# Patient Record
Sex: Male | Born: 1975 | Hispanic: Yes | Marital: Single | Smoking: Never smoker
Health system: Southern US, Community
[De-identification: ages and names within clinical notes are randomized; demographics above are authoritative.]

## PROBLEM LIST (undated history)

## (undated) DIAGNOSIS — M109 Gout, unspecified: Secondary | ICD-10-CM

## (undated) DIAGNOSIS — H269 Unspecified cataract: Secondary | ICD-10-CM

## (undated) HISTORY — PX: CATARACT EXTRACTION: SUR2

---

## 2011-08-15 ENCOUNTER — Emergency Department (HOSPITAL_COMMUNITY)
Admission: EM | Admit: 2011-08-15 | Discharge: 2011-08-16 | Disposition: A | Discharge status: Home / Self Care | Payer: Self-pay | Attending: Emergency Medicine | Admitting: Emergency Medicine

## 2011-08-15 DIAGNOSIS — R229 Localized swelling, mass and lump, unspecified: Secondary | ICD-10-CM | POA: Insufficient documentation

## 2011-08-17 ENCOUNTER — Inpatient Hospital Stay (INDEPENDENT_AMBULATORY_CARE_PROVIDER_SITE_OTHER)
Admission: RE | Admit: 2011-08-17 | Discharge: 2011-08-17 | Disposition: A | Discharge status: Home / Self Care | Payer: Self-pay | Source: Ambulatory Visit | Attending: Family Medicine | Admitting: Family Medicine

## 2011-08-17 DIAGNOSIS — I889 Nonspecific lymphadenitis, unspecified: Secondary | ICD-10-CM

## 2011-08-17 DIAGNOSIS — IMO0002 Reserved for concepts with insufficient information to code with codable children: Secondary | ICD-10-CM

## 2011-08-20 LAB — CULTURE, ROUTINE-ABSCESS

## 2013-03-30 ENCOUNTER — Encounter (HOSPITAL_COMMUNITY): Payer: Self-pay | Admitting: *Deleted

## 2013-03-30 ENCOUNTER — Emergency Department (HOSPITAL_COMMUNITY): Payer: BC Managed Care – PPO

## 2013-03-30 DIAGNOSIS — Y9389 Activity, other specified: Secondary | ICD-10-CM | POA: Insufficient documentation

## 2013-03-30 DIAGNOSIS — Z8669 Personal history of other diseases of the nervous system and sense organs: Secondary | ICD-10-CM | POA: Insufficient documentation

## 2013-03-30 DIAGNOSIS — S93409A Sprain of unspecified ligament of unspecified ankle, initial encounter: Secondary | ICD-10-CM | POA: Insufficient documentation

## 2013-03-30 DIAGNOSIS — X58XXXA Exposure to other specified factors, initial encounter: Secondary | ICD-10-CM | POA: Insufficient documentation

## 2013-03-30 DIAGNOSIS — Y9366 Activity, soccer: Secondary | ICD-10-CM | POA: Insufficient documentation

## 2013-03-30 DIAGNOSIS — Y9239 Other specified sports and athletic area as the place of occurrence of the external cause: Secondary | ICD-10-CM | POA: Insufficient documentation

## 2013-03-30 DIAGNOSIS — Y92838 Other recreation area as the place of occurrence of the external cause: Secondary | ICD-10-CM | POA: Insufficient documentation

## 2013-03-30 NOTE — ED Notes (Signed)
Pt c/o left ankle pain and swelling x 4 days also has "knot" on left knee.  Denies any known injury but does play soccer and states should have injured it during soccer.

## 2013-03-31 ENCOUNTER — Emergency Department (HOSPITAL_COMMUNITY)
Admission: EM | Admit: 2013-03-31 | Discharge: 2013-03-31 | Disposition: A | Discharge status: Home / Self Care | Payer: BC Managed Care – PPO | Attending: Emergency Medicine | Admitting: Emergency Medicine

## 2013-03-31 DIAGNOSIS — S93402A Sprain of unspecified ligament of left ankle, initial encounter: Secondary | ICD-10-CM

## 2013-03-31 HISTORY — DX: Unspecified cataract: H26.9

## 2013-03-31 MED ORDER — IBUPROFEN 800 MG PO TABS: 800.0000 mg | ORAL_TABLET | Freq: Three times a day (TID) | ORAL | Status: DC

## 2013-03-31 NOTE — ED Notes (Signed)
No answer in waiting room 

## 2013-03-31 NOTE — ED Provider Notes (Signed)
Medical screening examination/treatment/procedure(s) were performed by non-physician practitioner and as supervising physician I was immediately available for consultation/collaboration.  Sion Thane M Everson Mott, MD 03/31/13 0751 

## 2013-03-31 NOTE — ED Provider Notes (Signed)
History     CSN: [id number]  Arrival date & time 03/30/13  2246   None     Chief Complaint  Patient presents with  . Ankle Pain    (Consider location/radiation/quality/duration/timing/severity/associated sxs/prior treatment) HPI History provided by pt.   Pt has had pain and edema of L ankle for the past several days.  Denies trauma but plays soccer on a regular basis.  Aggravated by bearing weight.  Has not taken anything for pain.  No associated fever, skin changes or paresthesias.   Past Medical History  Diagnosis Date  . Cataract     Past Surgical History  Procedure Laterality Date  . Cataract extraction      History reviewed. No pertinent family history.  History  Substance Use Topics  . Smoking status: Never Smoker   . Smokeless tobacco: Not on file  . Alcohol Use: Yes      Review of Systems  All other systems reviewed and are negative.    Allergies  Vicodin  Home Medications   Current Outpatient Rx  Name  Route  Sig  Dispense  Refill  . ibuprofen (ADVIL,MOTRIN) 800 MG tablet   Oral   Take 1 tablet (800 mg total) by mouth 3 (three) times daily.   12 tablet   0     BP 155/88  Pulse 81  Temp(Src) 98.4 F (36.9 C)  Resp 16  SpO2 98%  Physical Exam  Nursing note and vitals reviewed. Constitutional: He is oriented to person, place, and time. He appears well-developed and well-nourished. No distress.  HENT:  Head: Normocephalic and atraumatic.  Eyes:  Normal appearance  Neck: Normal range of motion.  Pulmonary/Chest: Effort normal.  Musculoskeletal: Normal range of motion.  Mild edema L lateral malleolus when compared to R.  No erythema or warmth.  No edema of foot or calf.  Tenderness most prominent inferior to both medial and lateral malleolus.  Reports pain w/ passive ROM in every direction, though he does not appear to be uncomfortable. 2+ DP pulse and distal sensation intact.    Neurological: He is alert and oriented to person, place,  and time.  Psychiatric: He has a normal mood and affect. His behavior is normal.    ED Course  Procedures (including critical care time)  Labs Reviewed - No data to display Dg Ankle Complete Left  03/30/2013  *RADIOLOGY REPORT*  Clinical Data: Medial leg pain.  No recent injury.  LEFT ANKLE COMPLETE - 3+ VIEW  Comparison: None.  Findings: Deformity the distal fibula is present compatible with a healed fracture.  There is also deformity of the distal tibial metaphysis consistent with healed fracture.  The ankle mortise is congruent.  Mild ankle osteoarthritis.  Talar dome intact. Alignment of the ankle is anatomic.  No radiographic evidence of effusion.  Small anterior tibial spur.  IMPRESSION: No acute osseous abnormality.  Old post-traumatic changes of the distal leg.  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   Original Report Authenticated By: Andreas Newport, M.D.      1. Sprain of left ankle, initial encounter       MDM  36yo M presents w/ non-traumatic L ankle pain/edema.  No signs of joint infection on exam.  Xray neg for fx/dislocation.  NV intact.  Will treat symptomatically for sprain.  Nursing staff applied ASO (pt declined crutches) and I recommended NSAID and RICE at home.  Return precautions discussed. 2:45 AM         Santina Evans  E Alianys Chacko, PA-C 03/31/13 0245

## 2013-08-04 ENCOUNTER — Emergency Department (INDEPENDENT_AMBULATORY_CARE_PROVIDER_SITE_OTHER)
Admission: EM | Admit: 2013-08-04 | Discharge: 2013-08-04 | Disposition: A | Discharge status: Home / Self Care | Payer: BC Managed Care – PPO | Source: Home / Self Care

## 2013-08-04 ENCOUNTER — Encounter (HOSPITAL_COMMUNITY): Payer: Self-pay | Admitting: Emergency Medicine

## 2013-08-04 DIAGNOSIS — H11009 Unspecified pterygium of unspecified eye: Secondary | ICD-10-CM

## 2013-08-04 DIAGNOSIS — H1013 Acute atopic conjunctivitis, bilateral: Secondary | ICD-10-CM

## 2013-08-04 DIAGNOSIS — H11003 Unspecified pterygium of eye, bilateral: Secondary | ICD-10-CM

## 2013-08-04 DIAGNOSIS — H1045 Other chronic allergic conjunctivitis: Secondary | ICD-10-CM

## 2013-08-04 NOTE — ED Provider Notes (Signed)
Ricky Callahan is a 37 y.o. male who presents to Urgent Care today for bilateral eye irritation and headache with visual accommodation. Patient has a history of congenital cataracts status post surgery 5 years ago at [facility]. For the last 3 weeks he notes his eye irritation. He denies any loss of vision lack of coordination weakness numbness difficulty walking fevers chills nausea vomiting or diarrhea. He denies any injury. Feels well otherwise    Past Medical History  Diagnosis Date  . Cataract    History  Substance Use Topics  . Smoking status: Never Smoker   . Smokeless tobacco: Not on file  . Alcohol Use: Yes   ROS as above Medications reviewed. No current facility-administered medications for this encounter.   Current Outpatient Prescriptions  Medication Sig Dispense Refill  . ibuprofen (ADVIL,MOTRIN) 800 MG tablet Take 1 tablet (800 mg total) by mouth 3 (three) times daily.  12 tablet  0    Exam:  BP 141/98  Pulse 90  Temp(Src) 98.2 F (36.8 C) (Oral)  Resp 20  SpO2 100% Gen: Well NAD HEENT: EOMI,  MMM, bilateral pterygium present . Mild conjunctival injection. Funduscopic exam is normal bilaterally. PERRLA laterally  Lungs: CTABL Nl WOB Heart: RRR no MRG Abd: NABS, NT, ND Exts: Non edematous BL  LE, warm and well perfused.   Visual Acuity - Bilateral Distance: 20/20 ; R Distance: 20/30 ; L Distance: 20/30   No results found for this or any previous visit (from the past 24 hour(s)). No results found.  Assessment and Plan: 37 y.o. male with allergic conjunctivitis and pterygium.  Plan to treat with sustained artificial tears and Zaditor eyedrops. Followup with ophthalmology sooner rather than later Discussed warning signs or symptoms. Please see discharge instructions. Patient expresses understanding.      Rodolph Bong, MD 08/04/13 [phone]

## 2013-08-04 NOTE — ED Notes (Signed)
Pt c/o poss conjunctivitis onset 3 weeks Hx of cataract surgery on both eyes <5 yrs  Sxs today include: irritation, like "sand in eyes", redness,  Denies: fevers Alert w/no signs of acute distress.

## 2016-07-01 ENCOUNTER — Emergency Department (HOSPITAL_COMMUNITY)
Admission: EM | Admit: 2016-07-01 | Discharge: 2016-07-02 | Disposition: A | Discharge status: Home / Self Care | Payer: Self-pay | Attending: Emergency Medicine | Admitting: Emergency Medicine

## 2016-07-01 ENCOUNTER — Encounter (HOSPITAL_COMMUNITY): Payer: Self-pay

## 2016-07-01 DIAGNOSIS — M25561 Pain in right knee: Secondary | ICD-10-CM | POA: Insufficient documentation

## 2016-07-01 DIAGNOSIS — Z79899 Other long term (current) drug therapy: Secondary | ICD-10-CM | POA: Insufficient documentation

## 2016-07-01 HISTORY — DX: Gout, unspecified: M10.9

## 2016-07-01 MED ORDER — INDOMETHACIN 25 MG PO CAPS: 25.0000 mg | ORAL_CAPSULE | Freq: Three times a day (TID) | ORAL | 0 refills | Status: DC | PRN

## 2016-07-01 MED ORDER — DEXAMETHASONE SODIUM PHOSPHATE 10 MG/ML IJ SOLN
10.0000 mg | Freq: Once | INTRAMUSCULAR | Status: AC
  Administered 2016-07-01: 10 mg via INTRAMUSCULAR
  Filled 2016-07-01: qty 1

## 2016-07-01 MED ORDER — IBUPROFEN 400 MG PO TABS
800.0000 mg | ORAL_TABLET | Freq: Once | ORAL | Status: AC
  Administered 2016-07-01: 800 mg via ORAL
  Filled 2016-07-01: qty 2

## 2016-07-01 MED ORDER — COLCHICINE 0.6 MG PO TABS: ORAL_TABLET | ORAL | 0 refills | Status: DC

## 2016-07-01 NOTE — ED Triage Notes (Signed)
Patient here with right knee pain x 1 day, states that it is his gout acting up again, denies trauma

## 2016-07-01 NOTE — Discharge Instructions (Signed)
Please take your medications as prescribed. Follow-up with your doctor for reevaluation and medication management. Return to ED for new or worsening symptoms as we discussed.

## 2016-07-01 NOTE — ED Provider Notes (Signed)
[facility]-EMERGENCY DEPT Provider Note   CSN: [id number] Arrival date & time: 07/01/16  1122  First Provider Contact:  First MD Initiated Contact with Patient 07/01/16 1242     By signing my name below, I, Rosario Adie, attest that this documentation has been prepared under the direction and in the presence of Joycie Peek, PA-C.  Electronically Signed: Rosario Adie, ED Scribe. 07/01/16. 12:46 PM.  History   Chief Complaint Chief Complaint  Patient presents with  . Knee Pain   The history is provided by the patient. No language interpreter was used.   HPI Comments: Ricky Callahan is a 40 y.o. male with a PMHx of Gout, who presents to the Emergency Department complaining of gradual onset, gradually worsening, constant right knee pain x 1 day. Pt reports that he has a hx of Gout; states pain today is similar to hx of pain. Pt reports that he ate "a little bit of shrimp" prior to the onset of his pain. No trauma/injury/PSHx/exercise to the knee. Pt's pain is worsened with movement. He states that he takes Cotranzine and Indomethacin for his pain at home, but has recently ran out of both. Pt reports that he sees a practice in [place] who writes his prescriptions for pain management. Denies weakness, numbness, tingling, leg swelling, fevers, or any other symptoms.    Past Medical History:  Diagnosis Date  . Cataract   . Gout     There are no active problems to display for this patient.   Past Surgical History:  Procedure Laterality Date  . CATARACT EXTRACTION      Home Medications    Prior to Admission medications   Medication Sig Start Date End Date Taking? Authorizing Provider  colchicine 0.6 MG tablet Take 0.6mg  (one tablet) by mouth every 1-2 hours until one of the following occurs: 1.  The pain is gone 2.  The maximum dose has been given ( no more than 3 tabs in 3 hours or 10 tabs in 24 hours) 3.  The side effects outweight the benefits 07/01/16   Joycie Peek, PA-C  ibuprofen (ADVIL,MOTRIN) 800 MG tablet Take 1 tablet (800 mg total) by mouth 3 (three) times daily. 03/31/13   Ruby Cola, PA-C  indomethacin (INDOCIN) 25 MG capsule Take 1 capsule (25 mg total) by mouth 3 (three) times daily as needed. 07/01/16   Joycie Peek, PA-C    Family History No family history on file.  Social History Social History  Substance Use Topics  . Smoking status: Never Smoker  . Smokeless tobacco: Never Used  . Alcohol use Yes   Allergies   Vicodin [hydrocodone-acetaminophen]  Review of Systems Review of Systems A complete 10 system review of systems was obtained and all systems are negative except as noted in the HPI and PMH.   Physical Exam Updated Vital Signs BP 130/93 (BP Location: Left Arm)   Pulse 70   Temp 99.3 F (37.4 C) (Oral)   Resp 18   SpO2 100%   Physical Exam  Constitutional: He appears well-developed and well-nourished.  HENT:  Head: Normocephalic.  Eyes: Conjunctivae are normal.  Cardiovascular: Normal rate and intact distal pulses.   Pulmonary/Chest: Effort normal. No respiratory distress.  Abdominal: He exhibits no distension.  Musculoskeletal: Normal range of motion. He exhibits tenderness. He exhibits no edema.  Full AROM of the left knee. No appreciable swelling. TTP over the insertion of the patellar tendon.   Neurological: He is alert.  Gait baseline  Skin:  Skin is warm and dry.  Psychiatric: He has a normal mood and affect. His behavior is normal.  Nursing note and vitals reviewed.  ED Treatments / Results  DIAGNOSTIC STUDIES: Oxygen Saturation is 100% on RA, normal by my interpretation.   COORDINATION OF CARE: 12:45 PM-Discussed next steps with pt. Pt verbalized understanding and is agreeable with the plan.   Labs (all labs ordered are listed, but only abnormal results are displayed) Labs Reviewed - No data to display  EKG  EKG Interpretation None      Radiology No results  found.  Procedures Procedures (including critical care time)  Medications Ordered in ED Medications  dexamethasone (DECADRON) injection 10 mg (10 mg Intramuscular Given 07/01/16 1255)  ibuprofen (ADVIL,MOTRIN) tablet 800 mg (800 mg Oral Given 07/01/16 1254)     Initial Impression / Assessment and Plan / ED Course  I have reviewed the triage vital signs and the nursing notes.  Pertinent labs & imaging results that were available during my care of the patient were reviewed by me and considered in my medical decision making (see chart for details).  Clinical Course     Patient presents for evaluation of right knee pain that he attributes to gouty flare. Symptoms started after eating shrimp. Symptoms possibly due to gout versus musculoskeletal etiology. No evidence of hemarthrosis, septic joint or other emergent pathology. Given IM Decadron, NSAIDs and emergency department, DC with patient's gout medications. Follow up with PCP for reevaluation. Return precautions discussed. Overall appears well, nontoxic, hemodynamically stable and appropriate for discharge.  Final Clinical Impressions(s) / ED Diagnoses   Final diagnoses:  Right knee pain    New Prescriptions New Prescriptions   COLCHICINE 0.6 MG TABLET    Take 0.6mg  (one tablet) by mouth every 1-2 hours until one of the following occurs: 1.  The pain is gone 2.  The maximum dose has been given ( no more than 3 tabs in 3 hours or 10 tabs in 24 hours) 3.  The side effects outweight the benefits   INDOMETHACIN (INDOCIN) 25 MG CAPSULE    Take 1 capsule (25 mg total) by mouth 3 (three) times daily as needed.   I personally performed the services described in this documentation, which was scribed in my presence. The recorded information has been reviewed and is accurate.     Joycie Peek, PA-C 07/01/16 1441    Glynn Octave, MD 07/01/16 1535

## 2017-07-19 ENCOUNTER — Emergency Department (HOSPITAL_COMMUNITY): Payer: Self-pay

## 2017-07-19 ENCOUNTER — Inpatient Hospital Stay (HOSPITAL_COMMUNITY)
Admission: EM | Admit: 2017-07-19 | Discharge: 2017-07-21 | DRG: 917 | Disposition: A | Discharge status: Home / Self Care | Payer: Self-pay | Attending: Family Medicine | Admitting: Family Medicine

## 2017-07-19 ENCOUNTER — Encounter (HOSPITAL_COMMUNITY): Payer: Self-pay | Admitting: *Deleted

## 2017-07-19 DIAGNOSIS — F1120 Opioid dependence, uncomplicated: Secondary | ICD-10-CM | POA: Diagnosis present

## 2017-07-19 DIAGNOSIS — R404 Transient alteration of awareness: Secondary | ICD-10-CM

## 2017-07-19 DIAGNOSIS — Z7151 Drug abuse counseling and surveillance of drug abuser: Secondary | ICD-10-CM

## 2017-07-19 DIAGNOSIS — G9340 Encephalopathy, unspecified: Secondary | ICD-10-CM

## 2017-07-19 DIAGNOSIS — R739 Hyperglycemia, unspecified: Secondary | ICD-10-CM | POA: Diagnosis present

## 2017-07-19 DIAGNOSIS — I959 Hypotension, unspecified: Secondary | ICD-10-CM | POA: Diagnosis present

## 2017-07-19 DIAGNOSIS — Z885 Allergy status to narcotic agent status: Secondary | ICD-10-CM

## 2017-07-19 DIAGNOSIS — M109 Gout, unspecified: Secondary | ICD-10-CM | POA: Diagnosis present

## 2017-07-19 DIAGNOSIS — Z880 Allergy status to penicillin: Secondary | ICD-10-CM

## 2017-07-19 DIAGNOSIS — T50901A Poisoning by unspecified drugs, medicaments and biological substances, accidental (unintentional), initial encounter: Principal | ICD-10-CM | POA: Diagnosis present

## 2017-07-19 DIAGNOSIS — G92 Toxic encephalopathy: Secondary | ICD-10-CM | POA: Diagnosis present

## 2017-07-19 DIAGNOSIS — T50904A Poisoning by unspecified drugs, medicaments and biological substances, undetermined, initial encounter: Secondary | ICD-10-CM

## 2017-07-19 DIAGNOSIS — Z9109 Other allergy status, other than to drugs and biological substances: Secondary | ICD-10-CM

## 2017-07-19 DIAGNOSIS — F1910 Other psychoactive substance abuse, uncomplicated: Secondary | ICD-10-CM | POA: Diagnosis present

## 2017-07-19 DIAGNOSIS — Z79899 Other long term (current) drug therapy: Secondary | ICD-10-CM

## 2017-07-19 DIAGNOSIS — E860 Dehydration: Secondary | ICD-10-CM | POA: Diagnosis present

## 2017-07-19 DIAGNOSIS — R4189 Other symptoms and signs involving cognitive functions and awareness: Secondary | ICD-10-CM

## 2017-07-19 DIAGNOSIS — F1020 Alcohol dependence, uncomplicated: Secondary | ICD-10-CM | POA: Diagnosis present

## 2017-07-19 DIAGNOSIS — R9431 Abnormal electrocardiogram [ECG] [EKG]: Secondary | ICD-10-CM | POA: Diagnosis present

## 2017-07-19 LAB — I-STAT TROPONIN, ED: TROPONIN I, POC: 0 ng/mL (ref 0.00–0.08)

## 2017-07-19 LAB — CBC
HCT: 43.8 % (ref 39.0–52.0)
HEMOGLOBIN: 15 g/dL (ref 13.0–17.0)
MCH: 28.1 pg (ref 26.0–34.0)
MCHC: 34.2 g/dL (ref 30.0–36.0)
MCV: 82.2 fL (ref 78.0–100.0)
Platelets: 290 10*3/uL (ref 150–400)
RBC: 5.33 MIL/uL (ref 4.22–5.81)
RDW: 13.3 % (ref 11.5–15.5)
WBC: 9.2 10*3/uL (ref 4.0–10.5)

## 2017-07-19 LAB — HEPATIC FUNCTION PANEL
ALT: 18 U/L (ref 17–63)
AST: 22 U/L (ref 15–41)
Albumin: 3.9 g/dL (ref 3.5–5.0)
Alkaline Phosphatase: 57 U/L (ref 38–126)
Bilirubin, Direct: <0.1 mg/dL — ABNORMAL LOW (ref 0.1–0.5)
TOTAL PROTEIN: 6.9 g/dL (ref 6.5–8.1)
Total Bilirubin: 0.5 mg/dL (ref 0.3–1.2)

## 2017-07-19 LAB — ACETAMINOPHEN LEVEL: Acetaminophen (Tylenol), Serum: <10 ug/mL — ABNORMAL LOW (ref 10–30)

## 2017-07-19 LAB — BASIC METABOLIC PANEL
ANION GAP: 10 (ref 5–15)
BUN: 11 mg/dL (ref 6–20)
CO2: 25 mmol/L (ref 22–32)
Calcium: 8.9 mg/dL (ref 8.9–10.3)
Chloride: 103 mmol/L (ref 101–111)
Creatinine, Ser: 1.02 mg/dL (ref 0.61–1.24)
GFR calc non Af Amer: >60 mL/min (ref >60)
Glucose, Bld: 165 mg/dL — ABNORMAL HIGH (ref 65–99)
Potassium: 3.9 mmol/L (ref 3.5–5.1)
Sodium: 138 mmol/L (ref 135–145)

## 2017-07-19 LAB — ETHANOL

## 2017-07-19 LAB — RAPID URINE DRUG SCREEN, HOSP PERFORMED
AMPHETAMINES: NOT DETECTED
Barbiturates: NOT DETECTED
Benzodiazepines: NOT DETECTED
Cocaine: POSITIVE — AB
OPIATES: POSITIVE — AB
TETRAHYDROCANNABINOL: NOT DETECTED

## 2017-07-19 LAB — URINALYSIS, ROUTINE W REFLEX MICROSCOPIC
BILIRUBIN URINE: NEGATIVE
GLUCOSE, UA: NEGATIVE mg/dL
HGB URINE DIPSTICK: NEGATIVE
Ketones, ur: NEGATIVE mg/dL
Leukocytes, UA: NEGATIVE
Nitrite: NEGATIVE
PROTEIN: NEGATIVE mg/dL
Specific Gravity, Urine: 1.006 (ref 1.005–1.030)
pH: 6 (ref 5.0–8.0)

## 2017-07-19 LAB — SALICYLATE LEVEL: Salicylate Lvl: <7 mg/dL (ref 2.8–30.0)

## 2017-07-19 LAB — I-STAT CG4 LACTIC ACID, ED
LACTIC ACID, VENOUS: 1.83 mmol/L (ref 0.5–1.9)
Lactic Acid, Venous: 1.69 mmol/L (ref 0.5–1.9)

## 2017-07-19 LAB — HEMOGLOBIN A1C
HEMOGLOBIN A1C: 5.3 % (ref 4.8–5.6)
MEAN PLASMA GLUCOSE: 105.41 mg/dL

## 2017-07-19 LAB — CBG MONITORING, ED: Glucose-Capillary: 172 mg/dL — ABNORMAL HIGH (ref 65–99)

## 2017-07-19 LAB — CK: Total CK: 130 U/L (ref 49–397)

## 2017-07-19 MED ORDER — ACETAMINOPHEN 325 MG PO TABS
650.0000 mg | ORAL_TABLET | Freq: Four times a day (QID) | ORAL | Status: DC | PRN
  Administered 2017-07-21: 650 mg via ORAL
  Filled 2017-07-19: qty 2

## 2017-07-19 MED ORDER — ACETAMINOPHEN 650 MG RE SUPP: 650.0000 mg | Freq: Four times a day (QID) | RECTAL | Status: DC | PRN

## 2017-07-19 MED ORDER — NALOXONE HCL 0.4 MG/ML IJ SOLN
0.4000 mg | Freq: Once | INTRAMUSCULAR | Status: AC
  Administered 2017-07-19: 0.4 mg via INTRAVENOUS
  Filled 2017-07-19: qty 1

## 2017-07-19 MED ORDER — ONDANSETRON HCL 4 MG/2ML IJ SOLN: 4.0000 mg | Freq: Four times a day (QID) | INTRAMUSCULAR | Status: DC | PRN

## 2017-07-19 MED ORDER — ENOXAPARIN SODIUM 40 MG/0.4ML ~~LOC~~ SOLN
40.0000 mg | SUBCUTANEOUS | Status: DC
  Administered 2017-07-20: 40 mg via SUBCUTANEOUS
  Filled 2017-07-19 (×2): qty 0.4

## 2017-07-19 MED ORDER — SODIUM CHLORIDE 0.9 % IV BOLUS (SEPSIS)
1000.0000 mL | Freq: Once | INTRAVENOUS | Status: AC
  Administered 2017-07-19: 1000 mL via INTRAVENOUS

## 2017-07-19 MED ORDER — SENNOSIDES-DOCUSATE SODIUM 8.6-50 MG PO TABS: 1.0000 | ORAL_TABLET | Freq: Every evening | ORAL | Status: DC | PRN

## 2017-07-19 MED ORDER — SODIUM CHLORIDE 0.9 % IV SOLN
INTRAVENOUS | Status: AC
  Administered 2017-07-19 – 2017-07-20 (×2): via INTRAVENOUS

## 2017-07-19 MED ORDER — ONDANSETRON HCL 4 MG PO TABS
4.0000 mg | ORAL_TABLET | Freq: Four times a day (QID) | ORAL | Status: DC | PRN
Start: 2017-07-19 — End: 2017-07-21

## 2017-07-19 MED ORDER — NALOXONE HCL 2 MG/2ML IJ SOSY: 0.2500 mg/h | PREFILLED_SYRINGE | INTRAVENOUS | Status: DC

## 2017-07-19 MED ORDER — NALOXONE HCL 2 MG/2ML IJ SOSY
1.0000 mg | PREFILLED_SYRINGE | Freq: Once | INTRAMUSCULAR | Status: AC
  Administered 2017-07-19: 1 mg via INTRAVENOUS
  Filled 2017-07-19: qty 2

## 2017-07-19 NOTE — ED Provider Notes (Signed)
[facility] Provider Note   CSN: [id number] Arrival date & time: 07/19/17  4098     History   Chief Complaint Chief Complaint  Patient presents with  . Dizziness  . Altered Mental Status    HPI Ricky Callahan is a 41 y.o. male.  HPI   Level V caveat due to altered mental status.  Ricky Callahan is a 41 y.o. male, with a history of substance abuse, presenting to the ED with reported heroin use today. Patient was apparently at work when he began to feel tired and dizzy. He was brought to the ED by his boss. Patient was given narcan prior to my contact with him due to reported respiratory depression.    Past Medical History:  Diagnosis Date  . Cataract   . Gout     There are no active problems to display for this patient.   Past Surgical History:  Procedure Laterality Date  . CATARACT EXTRACTION         Home Medications    Prior to Admission medications   Medication Sig Start Date End Date Taking? Authorizing Provider  colchicine 0.6 MG tablet Take 0.6mg  (one tablet) by mouth every 1-2 hours until one of the following occurs: 1.  The pain is gone 2.  The maximum dose has been given ( no more than 3 tabs in 3 hours or 10 tabs in 24 hours) 3.  The side effects outweight the benefits Patient not taking: Reported on 07/19/2017 07/01/16   Joycie Peek, PA-C  ibuprofen (ADVIL,MOTRIN) 800 MG tablet Take 1 tablet (800 mg total) by mouth 3 (three) times daily. Patient not taking: Reported on 07/19/2017 03/31/13   Schinlever, Santina Evans, PA-C  indomethacin (INDOCIN) 25 MG capsule Take 1 capsule (25 mg total) by mouth 3 (three) times daily as needed. Patient not taking: Reported on 07/19/2017 07/01/16   Joycie Peek, PA-C    Family History History reviewed. No pertinent family history.  Social History Social History  Substance Use Topics  . Smoking status: Never Smoker  . Smokeless tobacco: Never Used  . Alcohol use Yes     Allergies   Penicillins;  Toradol [ketorolac tromethamine]; and Vicodin [hydrocodone-acetaminophen]   Review of Systems Review of Systems  Unable to perform ROS: Mental status change     Physical Exam Updated Vital Signs BP 117/72   Pulse (!) 103   Resp 14   SpO2 100%   Physical Exam  Constitutional: He appears well-developed and well-nourished. He appears lethargic. No distress.  HENT:  Head: Normocephalic and atraumatic.  Airway is clear.  Eyes: Pupils are equal, round, and reactive to light. Conjunctivae are normal.  Patient would not comply with EOM exam. Pupils approximately 2-3 mm bilaterally.  Neck: Neck supple.  Cardiovascular: Normal rate, regular rhythm, normal heart sounds and intact distal pulses.   Pulmonary/Chest: Effort normal and breath sounds normal. No respiratory distress.  Abdominal: Soft. There is no tenderness. There is no guarding.  Musculoskeletal: He exhibits no edema.  Lymphadenopathy:    He has no cervical adenopathy.  Neurological: He appears lethargic. GCS eye subscore is 3. GCS verbal subscore is 4. GCS motor subscore is 6.  Patient opens eyes to loud verbal stimuli. Will follow commands to move his extremities. Patient states he does not know where he is or what brought him to the ED.  Skin: Skin is warm and dry. He is not diaphoretic.  Psychiatric: He has a normal mood and affect. His behavior is normal.  Nursing note and vitals reviewed.    ED Treatments / Results  Labs (all labs ordered are listed, but only abnormal results are displayed) Labs Reviewed  BASIC METABOLIC PANEL - Abnormal; Notable for the following:       Result Value   Glucose, Bld 165 (*)    All other components within normal limits  HEPATIC FUNCTION PANEL - Abnormal; Notable for the following:    Bilirubin, Direct <0.1 (*)    All other components within normal limits  ACETAMINOPHEN LEVEL - Abnormal; Notable for the following:    Acetaminophen (Tylenol), Serum <10 (*)    All other components  within normal limits  CBG MONITORING, ED - Abnormal; Notable for the following:    Glucose-Capillary 172 (*)    All other components within normal limits  CULTURE, BLOOD (ROUTINE X 2)  CULTURE, BLOOD (ROUTINE X 2)  CBC  ETHANOL  SALICYLATE LEVEL  RAPID URINE DRUG SCREEN, HOSP PERFORMED  URINALYSIS, ROUTINE W REFLEX MICROSCOPIC  I-STAT CG4 LACTIC ACID, ED  I-STAT TROPONIN, ED  I-STAT CG4 LACTIC ACID, ED    EKG  EKG Interpretation  Date/Time:  Friday July 19 2017 09:48:26 EDT Ventricular Rate:  95 PR Interval:  138 QRS Duration: 90 QT Interval:  332 QTC Calculation: 417 R Axis:   81 Text Interpretation:  Normal sinus rhythm T wave abnormality, consider inferior ischemia Abnormal ECG T wave inversions in inferior leads, no previous tracing available Confirmed by Frederick Peers [phone]) on 07/19/2017 11:18:05 AM       Radiology Ct Head Wo Contrast  Result Date: 07/19/2017 CLINICAL DATA:  Doing heroin this am, came to ed due fatigue and dizziness x 2 hours EXAM: CT HEAD WITHOUT CONTRAST TECHNIQUE: Contiguous axial images were obtained from the base of the skull through the vertex without intravenous contrast. COMPARISON:  None. FINDINGS: Brain: No evidence of acute infarction, hemorrhage, hydrocephalus, extra-axial collection or mass lesion/mass effect. Vascular: No hyperdense vessel or unexpected calcification. Skull: No osseous abnormality. Sinuses/Orbits: Mild bilateral maxillary sinus mucosal thickening. Visualized mastoid sinuses are clear. Visualized orbits demonstrate no focal abnormality. Other: None IMPRESSION: No acute intracranial pathology. Electronically Signed   By: Elige Ko   On: 07/19/2017 15:52    Procedures Procedures (including critical care time)  Medications Ordered in ED Medications  sodium chloride 0.9 % bolus 1,000 mL (0 mLs Intravenous Stopped 07/19/17 1103)  naloxone (NARCAN) injection 0.4 mg (0.4 mg Intravenous Given 07/19/17 1031)  sodium chloride  0.9 % bolus 1,000 mL (0 mLs Intravenous Stopped 07/19/17 1344)     Initial Impression / Assessment and Plan / ED Course  I have reviewed the triage vital signs and the nursing notes.  Pertinent labs & imaging results that were available during my care of the patient were reviewed by me and considered in my medical decision making (see chart for details).  Clinical Course as of Jul 19 1742  Fri Jul 19, 2017  1130 Initial assessment. Initial findings as noted in the physical exam.  [SJ]  1439 Reassessed patient. No change in patient status. Girlfriend is at the bedside states she was not with the patient this morning. Patient was apparently at work and his boss took him to the ED. GF states patient regularly uses heroin by snorting it. She also states patient has gotten very little sleep in the last few days.  [SJ]  1729 Spoke with Dr. Laural Benes, hospitalist, who requested a resulted UDS and that we try 1 mg more of narcan  prior to admission.  [SJ]    Clinical Course User Index [SJ] Izzac Rockett C, PA-C    Patient presents with decreased mental status. High suspicion for substance overdose, possibly heroin or similar. Patient had some improvement with Narcan, however, his response was not dramatic, which leads [place] to the belief that there are other substances likely involved, such as Fentanyl.  Low suspicion for infectious etiology. Afebrile, no tachycardia, and no leukocytosis. No acute abnormalities on head CT. Patient's initial significant hypotension responded well to fluids, although patient remained borderline hypotensive throughout ED course.   Vitals:   07/19/17 1015 07/19/17 1030 07/19/17 1100 07/19/17 1116  BP: (!) 85/62 (!) 84/60 (!) 124/92 117/72  Pulse: 89 79 92 (!) 103  Resp: 10 (!) 9  14  SpO2: 94% 95% 100% 100%   Vitals:   07/19/17 1417 07/19/17 1430 07/19/17 1500 07/19/17 1530  BP: (!) 95/59 (!) 86/62 (!) 87/53 (!) 93/49  Pulse: 78 81 81 81  Resp: 17     Temp:        TempSrc:      SpO2: 97% 96% 97% 97%     Final Clinical Impressions(s) / ED Diagnoses   Final diagnoses:  Decreased level of consciousness    New Prescriptions New Prescriptions   No medications on file     Concepcion Living 07/19/17 1815    Little, Ambrose Finland, MD 07/25/17 2138

## 2017-07-19 NOTE — H&P (Signed)
History and Physical  Lamarcus Spira ZOX:[id number] DOB: [birth year] DOA: 07/19/2017  Referring physician: Clarene Duke PCP: Patient, No Pcp Per   Chief Complaint: lethargy  Historian: unable to get history from patient, history from ED provider  HPI: Ricky Callahan is a 41 y.o. male known heroine addict who presented to the ED with lethargy and dizziness.  He was brought to ED by his boss because he became dizzy and lethargic at work.  He reported took heroin today but not confirmed.  His girlfriend says that he is a chronic user and snorts heroin.  He apparently works as a Administrator.  He was noted to be hypotensive on arrival to ED and difficult to arouse and very lethargic. He was able to protect and maintain his airway.  Unable to get history from patient.  He was given IVFs in ED with improvement in the blood pressures but continues to be very lethargic.  He received a small dose of naloxone in ED with some improvement in symptoms but then rapidly becomes lethargic again.  There was concern that he will continue to decline and admission is being requested for observation.  He does not have a fever or signs or symptoms of sepsis. When the patient does briefly wake up he denies that he took anything then he rapidly falls asleep.    Severity of Illness: The appropriate patient status for this patient is OBSERVATION. Observation status is judged to be reasonable and necessary in order to provide the required intensity of service to ensure the patient's safety. The patient's presenting symptoms, physical exam findings, and initial radiographic and laboratory data in the context of their medical condition is felt to place them at decreased risk for further clinical deterioration. Furthermore, it is anticipated that the patient will be medically stable for discharge from the hospital within 2 midnights of admission. The following factors support the patient status of observation.   " The patient's  presenting symptoms include encephalopathy. " The physical exam findings include hypotension, lethargy, dehydration. " The initial radiographic and laboratory data are normal CT head.  Review of Systems: Unable to obtain as patient is lethargic and nonresponsive, nonverbal.   Past Medical History:  Diagnosis Date  . Cataract   . Gout    Past Surgical History:  Procedure Laterality Date  . CATARACT EXTRACTION     Social History:  reports that he has never smoked. He has never used smokeless tobacco. He reports that he drinks alcohol. He reports that he uses drugs.  Allergies  Allergen Reactions  . Penicillins Rash    Has patient had a PCN reaction causing immediate rash, facial/tongue/throat swelling, SOB or lightheadedness with hypotension: Yes Has patient had a PCN reaction causing severe rash involving mucus membranes or skin necrosis: No Has patient had a PCN reaction that required hospitalization: No Has patient had a PCN reaction occurring within the last 10 years: Yes If all of the above answers are "NO", then may proceed with Cephalosporin use.   . Toradol [Ketorolac Tromethamine] Nausea And Vomiting  . Vicodin [Hydrocodone-Acetaminophen] Itching and Rash    History reviewed. No pertinent family history.  Prior to Admission medications   Not on File   Physical Exam: Vitals:   07/19/17 1417 07/19/17 1430 07/19/17 1500 07/19/17 1530  BP: (!) 95/59 (!) 86/62 (!) 87/53 (!) 93/49  Pulse: 78 81 81 81  Resp: 17     Temp:      TempSrc:      SpO2:  97% 96% 97% 97%    General exam: Well built and nourished patient, lying comfortably supine on the gurney in no obvious distress. He is lethargic and snoring.  He is arousable with persistent and loud stimulation.  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   Head, eyes and ENT: Nontraumatic and normocephalic. Pupils equally reacting to light and accommodation. Oral mucosa  dry.  Neck: Supple. No JVD, carotid bruit or thyromegaly.  Lymphatics: No lymphadenopathy.  Respiratory system: Clear to auscultation. No increased work of breathing.  Cardiovascular system: S1 and S2 heard, RRR. No JVD, murmurs, gallops, clicks or pedal edema.  Gastrointestinal system: Abdomen is nondistended, soft and nontender. Normal bowel sounds heard. No organomegaly or masses appreciated.  Central nervous system: lethargic but arousable. No focal neurological deficits.  Extremities: Symmetric 5 x 5 power. Peripheral pulses symmetrically felt.   Skin: No track marks seen. No rashes or acute findings.  Musculoskeletal system: Negative exam.  Psychiatry: unable to assess.  Labs on Admission:  Basic Metabolic Panel:  Recent Labs Lab 07/19/17 1010  NA 138  K 3.9  CL 103  CO2 25  GLUCOSE 165*  BUN 11  CREATININE 1.02  CALCIUM 8.9   Liver Function Tests:  Recent Labs Lab 07/19/17 1010  AST 22  ALT 18  ALKPHOS 57  BILITOT 0.5  PROT 6.9  ALBUMIN 3.9   No results for input(s): LIPASE, AMYLASE in the last 168 hours. No results for input(s): AMMONIA in the last 168 hours. CBC:  Recent Labs Lab 07/19/17 1010  WBC 9.2  HGB 15.0  HCT 43.8  MCV 82.2  PLT 290   Cardiac Enzymes: No results for input(s): CKTOTAL, CKMB, CKMBINDEX, TROPONINI in the last 168 hours.  BNP (last 3 results) No results for input(s): PROBNP in the last 8760 hours. CBG:  Recent Labs Lab 07/19/17 1010  GLUCAP 172*   Radiological Exams on Admission: Ct Head Wo Contrast  Result Date: 07/19/2017 CLINICAL DATA:  Doing heroin this am, came to ed due fatigue and dizziness x 2 hours EXAM: CT HEAD WITHOUT CONTRAST TECHNIQUE: Contiguous axial images were obtained from the base of the skull through the vertex without intravenous contrast. COMPARISON:  None. FINDINGS: Brain: No evidence of acute infarction, hemorrhage, hydrocephalus, extra-axial collection or mass lesion/mass effect.  Vascular: No hyperdense vessel or unexpected calcification. Skull: No osseous abnormality. Sinuses/Orbits: Mild bilateral maxillary sinus mucosal thickening. Visualized mastoid sinuses are clear. Visualized orbits demonstrate no focal abnormality. Other: None IMPRESSION: No acute intracranial pathology. Electronically Signed   By: Elige Ko   On: 07/19/2017 15:52   EKG: Independently reviewed. T wave inversion in inferior leads   Assessment/Plan Principal Problem:   Acute encephalopathy Active Problems:   Drug overdose   Polysubstance abuse   Heroin addiction (HCC)   Hypotension   Dehydration, moderate   Hyperglycemia  1. Acute encephalopathy - suspected from drug overdose, unfortunately the ED has not done a drug screen.  I ordered for one to be done STAT even if it requires that the patient be catheterized.  This is very important information that is needed to narrow differential diagnosis.  He has responded some to IV naloxone therefore I am placing him on a continuous naloxone infusion and monitoring him in the stepdown unit to be sure his airway continues to be protected because at this time I do not know what he has taken or why he is encephalopathic.  His CT brain is unremarkable and his neuro exam is reassuring.  He does not have fever or leukocytosis. He works outdoors and I'm concerned about heat stroke.  He has been in the ED for over 8 hours now and his temperature is normal.  He is dehydrated and he will be treated for that.  Neuro checks have been ordered.  Serum tylenol and salicylate levels are normal.  The patient denies that he took anything but the girlfriend is reporting that he is a chronic heroin user.  Checking HIV, TSH.  Continuous cardiac and pulse oximetry monitoring ordered.  Check and follow CK levels.  2. Dehydration - moderate - He has received several liters of fluid in the ED, given his age will continue maintenance IVF hydration in the SDU.  3. Hypotension -  likely secondary to drugs and dehydration, responding well to IVF hydration.  Monitor in SDU.  4. Heroin addiction and suspected polysubstance abuse  - consult social worker to speak with patient about treatment when medically stabilized.   5. Hyperglycemia - ? Reactive but will need to check an A1c to rule out diabetes mellitus.  6. Alcoholism - it is not clear how much alcohol he is drinking but will need to monitor for withdrawal. Did not order benzodiazepine coverage at this time given his severe lethargy and encephalopathy.  He could benefit from vitamin therapy when he is more awake and able to take pills.  7. Abnormal EKG - inferior lead T wave inversion possibly from drug, heat stroke, dehydration, will investigate further with serial troponin, continuous telemetry monitoring.  TSH pending, follow lytes.  Repeat a 12 lead EKG in AM.   DVT Prophylaxis: enoxaparin Code Status: Full   Family Communication: bedside  Disposition Plan: Home in 24 hours pending clinical course and recovery  Time spent: 57 mins  Standley Dakins, MD [facility] Pager [phone]  If 7PM-7AM, please contact night-coverage [email or web address] Password [facility] 07/19/2017, 6:25 PM

## 2017-07-19 NOTE — ED Notes (Signed)
Dr Laural Benes not answering dr opyd called back said to call dr danford  He was paged and he is not answering.

## 2017-07-19 NOTE — ED Notes (Signed)
Pt went to x-ray.

## 2017-07-19 NOTE — ED Triage Notes (Signed)
Pt reports doing heroin this am, came to ed due fatigue and dizziness x 2 hours. Grips are equal. Is hypotensive on arrival to ed, arousable with stimuli.

## 2017-07-19 NOTE — ED Notes (Addendum)
The pt is not following commands  He has been drinking water during the day per edp orders  Orders for swallow screen placed around 1800.. The pt will open his eyes but will not respond verbally now  Language barrier

## 2017-07-19 NOTE — ED Notes (Signed)
Pt asking for water  More alert unable to get hold of dr blouont  Also  Waiting for someone to come look at the pt again.

## 2017-07-19 NOTE — ED Notes (Signed)
2nd bolus started  

## 2017-07-19 NOTE — ED Notes (Signed)
Dr blout here to see no narcan drip  Pt may go to 3[facility] again

## 2017-07-19 NOTE — ED Notes (Signed)
Pt is lethargic but opens eyes to aggressive stimuli.

## 2017-07-19 NOTE — ED Notes (Signed)
The pt has voided  But the male with him poured it out

## 2017-07-19 NOTE — ED Notes (Signed)
Pt just returned from c-t 

## 2017-07-19 NOTE — ED Notes (Signed)
Dr danford called back and he wants me to call dr Bruna Potter

## 2017-07-19 NOTE — ED Notes (Signed)
Pt will not stay awake

## 2017-07-19 NOTE — ED Notes (Signed)
Report called to rn on 3w 

## 2017-07-19 NOTE — ED Notes (Signed)
The pt is asking for water and asking for the urinal when he needs it  More alert   Admitting dcotor dr bloount has been called about the bed status  The pt has not been on a narcan drip  He did not respond to 1mg  of narcan given around 1915

## 2017-07-19 NOTE — ED Notes (Signed)
Visitor at bedside.

## 2017-07-19 NOTE — ED Notes (Addendum)
Pt assigned to 3w  But the pt has an order for a narcan drip   They do not take these drips there  Pt will need to be reassigned

## 2017-07-19 NOTE — ED Notes (Signed)
ED Provider at bedside. 

## 2017-07-19 NOTE — ED Notes (Signed)
Page placed to admitting doctor  

## 2017-07-19 NOTE — ED Notes (Signed)
The pt is more responsive than he was originally

## 2017-07-19 NOTE — ED Notes (Signed)
Family member at the bedside  Trying to wake the pt up on my request.  Pt keeps sleeping

## 2017-07-20 DIAGNOSIS — G9340 Encephalopathy, unspecified: Secondary | ICD-10-CM | POA: Diagnosis present

## 2017-07-20 DIAGNOSIS — T50904D Poisoning by unspecified drugs, medicaments and biological substances, undetermined, subsequent encounter: Secondary | ICD-10-CM

## 2017-07-20 LAB — COMPREHENSIVE METABOLIC PANEL
ALK PHOS: 48 U/L (ref 38–126)
ALT: 14 U/L — AB (ref 17–63)
AST: 18 U/L (ref 15–41)
Albumin: 3.2 g/dL — ABNORMAL LOW (ref 3.5–5.0)
Anion gap: 5 (ref 5–15)
BILIRUBIN TOTAL: 0.9 mg/dL (ref 0.3–1.2)
BUN: 6 mg/dL (ref 6–20)
CALCIUM: 8.5 mg/dL — AB (ref 8.9–10.3)
CO2: 25 mmol/L (ref 22–32)
CREATININE: 0.75 mg/dL (ref 0.61–1.24)
Chloride: 114 mmol/L — ABNORMAL HIGH (ref 101–111)
Glucose, Bld: 94 mg/dL (ref 65–99)
Potassium: 3.6 mmol/L (ref 3.5–5.1)
Sodium: 144 mmol/L (ref 135–145)
TOTAL PROTEIN: 5.8 g/dL — AB (ref 6.5–8.1)

## 2017-07-20 LAB — CBC
HCT: 41.4 % (ref 39.0–52.0)
HEMOGLOBIN: 13.6 g/dL (ref 13.0–17.0)
MCH: 27.5 pg (ref 26.0–34.0)
MCHC: 32.9 g/dL (ref 30.0–36.0)
MCV: 83.6 fL (ref 78.0–100.0)
Platelets: 240 10*3/uL (ref 150–400)
RBC: 4.95 MIL/uL (ref 4.22–5.81)
RDW: 13.6 % (ref 11.5–15.5)
WBC: 7.7 10*3/uL (ref 4.0–10.5)

## 2017-07-20 LAB — TSH: TSH: 0.214 u[IU]/mL — AB (ref 0.350–4.500)

## 2017-07-20 LAB — TROPONIN I
Troponin I: <0.03 ng/mL (ref <0.03)
Troponin I: <0.03 ng/mL (ref <0.03)

## 2017-07-20 LAB — MRSA PCR SCREENING: MRSA by PCR: NEGATIVE

## 2017-07-20 LAB — CK: Total CK: 163 U/L (ref 49–397)

## 2017-07-20 LAB — MAGNESIUM: Magnesium: 2.3 mg/dL (ref 1.7–2.4)

## 2017-07-20 LAB — HIV ANTIBODY (ROUTINE TESTING W REFLEX): HIV SCREEN 4TH GENERATION: NONREACTIVE

## 2017-07-20 LAB — T4, FREE: Free T4: 0.99 ng/dL (ref 0.61–1.12)

## 2017-07-20 MED ORDER — NALOXONE HCL 0.4 MG/ML IJ SOLN
1.0000 mg | Freq: Once | INTRAMUSCULAR | Status: AC
  Administered 2017-07-20: 1 mg via INTRAVENOUS
  Filled 2017-07-20: qty 3

## 2017-07-20 NOTE — Evaluation (Signed)
Clinical/Bedside Swallow Evaluation Patient Details  Name: Ricky Callahan MRN: [id number] Date of Birth: 1976/07/18  Today's Date: 07/20/2017 Time: SLP Start Time (ACUTE ONLY): 0941 SLP Stop Time (ACUTE ONLY): 0956 SLP Time Calculation (min) (ACUTE ONLY): 15 min  Past Medical History:  Past Medical History:  Diagnosis Date  . Cataract   . Gout    Past Surgical History:  Past Surgical History:  Procedure Laterality Date  . CATARACT EXTRACTION     HPI:  41 y.o.maleknown heroine addict who presented to the ED with lethargy and dizziness. He was brought to ED by his boss because he became dizzy and lethargic at work. He reported took heroin, but not confirmed.His girlfriend says that he is a chronic user and snorts heroin. Admitted with acute encephalopathy, CT neg however MRI pending.    Assessment / Plan / Recommendation Clinical Impression  Patient's oropharyngeal swallow appears within functional limits with adequate airway protection at bedside. Pt is alert, responding to speech in Spanish to his girlfriend and in [place] to SLP. Follows all basic commands with girlfriend occasionally interpreting. No overt signs of aspiration noted with any consistency despite challenging. Oral preparation, control, clearance and attention to PO adequate. Recommend regular diet with thin liquids, medications whole with liquid. No further ST needs identified, SLP will s/o.  SLP Visit Diagnosis: Dysphagia, unspecified (R13.10)    Aspiration Risk  No limitations    Diet Recommendation Regular;Thin liquid   Liquid Administration via: Cup;Straw Medication Administration: Whole meds with liquid Supervision: Patient able to self feed    Other  Recommendations Oral Care Recommendations: Oral care BID   Follow up Recommendations None      Frequency and Duration            Prognosis Prognosis for Safe Diet Advancement: Good      Swallow Study   General Date of Onset: 07/19/17 HPI: 41  y.o.maleknown heroine addict who presented to the ED with lethargy and dizziness. He was brought to ED by his boss because he became dizzy and lethargic at work. He reported took heroin, but not confirmed.His girlfriend says that he is a chronic user and snorts heroin. Admitted with acute encephalopathy, CT neg however MRI pending.  Type of Study: Bedside Swallow Evaluation Previous Swallow Assessment: none in chart Diet Prior to this Study: NPO Temperature Spikes Noted: No Respiratory Status: Room air History of Recent Intubation: No Behavior/Cognition: Alert;Cooperative Oral Cavity Assessment: Within Functional Limits Oral Care Completed by SLP: No Oral Cavity - Dentition: Adequate natural dentition Vision: Functional for self-feeding Self-Feeding Abilities: Able to feed self Patient Positioning: Upright in bed Baseline Vocal Quality: Normal Volitional Cough: Strong Volitional Swallow: Able to elicit    Oral/Motor/Sensory Function Overall Oral Motor/Sensory Function: Within functional limits   Ice Chips Ice chips: Within functional limits Presentation: Spoon;Self Fed;Cup   Thin Liquid Thin Liquid: Within functional limits Presentation: Cup;Self Fed    Nectar Thick Nectar Thick Liquid: Not tested   Honey Thick Honey Thick Liquid: Not tested   Puree Puree: Within functional limits Presentation: Self Fed;Spoon   Solid   GO   Solid: Within functional limits Presentation: Self Fed        Arlana Lindau 07/20/2017,9:59 AM Rondel Baton, MS, CCC-SLP Speech-Language Pathologist [phone]

## 2017-07-20 NOTE — Consult Note (Signed)
NEURO HOSPITALIST CONSULT NOTE   Requesting physician: Dr. Laural Benes  Reason for Consult: AMS  History obtained from:  Chart  HPI:                                                                                                                                          Ricky Callahan is an 41 y.o. male with a known heroin addiction with a UDS also positive for cocaine presented to [facility] with lethargy and dizziness from work on 07/19/17. In the ED, he was hypotensive and difficult to arouse. Responsiveness was increased transiently with IVF and narcan.   During his ED course, Ricky Callahan became more responsive, throughout yesterday afternoon and evening, but had reduced responsiveness again around 2000.   Neurology was consulted for continued AMS.  Per his nurse, Ricky Callahan was given an additional dose of narcan this morning, around 0730. After this, he woke up, began eating and conversing. He called his girlfriend to have her visit him. He confirmed that the last time he used heroin was yesterday morning. Denies history of seizure.  Past Medical History:  Diagnosis Date  . Cataract   . Gout     Past Surgical History:  Procedure Laterality Date  . CATARACT EXTRACTION      History reviewed. No pertinent family history.  Social History:  reports that he has never smoked. He has never used smokeless tobacco. He reports that he drinks alcohol. He reports that he uses drugs.  Allergies  Allergen Reactions  . Penicillins Rash    Has patient had a PCN reaction causing immediate rash, facial/tongue/throat swelling, SOB or lightheadedness with hypotension: Yes Has patient had a PCN reaction causing severe rash involving mucus membranes or skin necrosis: No Has patient had a PCN reaction that required hospitalization: No Has patient had a PCN reaction occurring within the last 10 years: Yes If all of the above answers are "NO", then may proceed with Cephalosporin use.    . Toradol [Ketorolac Tromethamine] Nausea And Vomiting  . Vicodin [Hydrocodone-Acetaminophen] Itching and Rash    MEDICATIONS:                                                                                                                   No outpatient prescriptions have been  marked as taking for the 07/19/17 encounter [facility] Encounter).     Review Of Systems:                                                                                                           History obtained from the patient  General: Negative for chills, fever Psychological: Negative for any known behavioral disorder Ophthalmic: Negative for blurry or double vision ENT: Negative for vertigo Respiratory: Negative for cough, shortness  Cardiovascular: Negative for chest pain,  Gastrointestinal: Negative for abdominal pain Musculoskeletal: Negative for joint swelling or muscular weakness Neurological: As noted in HPI Dermatological: Negative for rash or skin changes, numbness or tingling  Blood pressure 113/81, pulse 79, temperature 98.1 F (36.7 C), temperature source Oral, resp. rate (!) 21, height 5\' 10"  (1.778 m), weight 75.1 kg (165 lb 9.6 oz), SpO2 98 %.   Physical Examination:                                                                                                      General: WDWN male. Appears calm and comfortable in bed HEENT:  Normocephalic, no lesions, without obvious abnormality.  Normal external eye and conjunctiva.  Normal external ears. Normal external nose, mucus membranes and septum.  Normal pharynx. Cardiovascular: regular rate and rhythm, pulses palpable throughout   Pulmonary: CTA bilaterally Abdomen: Soft, non-tender Extremities: no joint deformities, effusion, or inflammation Musculoskeletal: no joint tenderness, deformity or swelling Skin: warm and dry, no hyperpigmentation, vitiligo, or suspicious lesions  Neurological Examination:                                                                                                Mental Status: Ricky Callahan is alert, oriented x 4, thought content appropriate.  Speech fluent without evidence of aphasia. Able to follow 3-step commands without difficulty. Cranial Nerves: II: Visual fields grossly normal, pupils are equal, round, reactive to light and accommodation. III,IV, VI: Ptosis not present, extra-ocular muscle movements intact bilaterally V,VII: Smile and eyebrow raise is symmetric. Facial light touch sensation intact bilaterally VIII: Hearing grossly intact IX,X: Uvula and palate rise symmetrically XI: SCM and bilateral shoulder shrug strength 5/5 XII: Midline tongue extension Motor: Right :     Upper extremity  5/5   Left:     Upper extremity   5/5          Lower extremity   5/5     Lower extremity   5/5 Pronator drift not present Sensory: Light touch intact throughout, bilaterally Deep Tendon Reflexes: 2+ and symmetric throughout Plantars: Right: downgoing   Left: downgoing Cerebellar: Finger-to-nose test without evidence of dysmetria or ataxia. Coordinated rapid alternating movements Gait: Normal gait and station   Lab Results: Basic Metabolic Panel:  Recent Labs Lab 07/19/17 1010 07/20/17 0325  NA 138 144  K 3.9 3.6  CL 103 114*  CO2 25 25  GLUCOSE 165* 94  BUN 11 6  CREATININE 1.02 0.75  CALCIUM 8.9 8.5*  MG  --  2.3    Liver Function Tests:  Recent Labs Lab 07/19/17 1010 07/20/17 0325  AST 22 18  ALT 18 14*  ALKPHOS 57 48  BILITOT 0.5 0.9  PROT 6.9 5.8*  ALBUMIN 3.9 3.2*   No results for input(s): LIPASE, AMYLASE in the last 168 hours. No results for input(s): AMMONIA in the last 168 hours.  CBC:  Recent Labs Lab 07/19/17 1010 07/20/17 0955  WBC 9.2 7.7  HGB 15.0 13.6  HCT 43.8 41.4  MCV 82.2 83.6  PLT 290 240    Cardiac Enzymes:  Recent Labs Lab 07/19/17 1610 07/19/17 2303 07/20/17 0325 07/20/17 0955  CKTOTAL 130  --  163  --   TROPONINI  --  <0.03  <0.03 <0.03    Lipid Panel: No results for input(s): CHOL, TRIG, HDL, CHOLHDL, VLDL, LDLCALC in the last 168 hours.  CBG:  Recent Labs Lab 07/19/17 1010  GLUCAP 172*    Microbiology: Results for orders placed or performed during the hospital encounter of 07/19/17  MRSA PCR Screening     Status: None   Collection Time: 07/20/17  2:34 AM  Result Value Ref Range Status   MRSA by PCR NEGATIVE NEGATIVE Final    Comment:        The GeneXpert MRSA Assay (FDA approved for NASAL specimens only), is one component of a comprehensive MRSA colonization surveillance program. It is not intended to diagnose MRSA infection nor to guide or monitor treatment for MRSA infections.     Coagulation Studies: No results for input(s): LABPROT, INR in the last 72 hours.  Imaging: Ct Head Wo Contrast  Result Date: 07/19/2017 CLINICAL DATA:  Doing heroin this am, came to ed due fatigue and dizziness x 2 hours EXAM: CT HEAD WITHOUT CONTRAST TECHNIQUE: Contiguous axial images were obtained from the base of the skull through the vertex without intravenous contrast. COMPARISON:  None. FINDINGS: Brain: No evidence of acute infarction, hemorrhage, hydrocephalus, extra-axial collection or mass lesion/mass effect. Vascular: No hyperdense vessel or unexpected calcification. Skull: No osseous abnormality. Sinuses/Orbits: Mild bilateral maxillary sinus mucosal thickening. Visualized mastoid sinuses are clear. Visualized orbits demonstrate no focal abnormality. Other: None IMPRESSION: No acute intracranial pathology. Electronically Signed   By: Elige Ko   On: 07/19/2017 15:52     Assessment  Ricky Callahan is a 41 year old male with a past medical history that is significant for polysubstance abuse who presented to [facility] from work with lethargy and unresponsiveness. While in the ED, he was also found to be hypotensive. Narcan and fluids transiently increased his awareness in the ED; however, he did not become  fully alert until this morning following an additional dose of Narcan. Based on his presentation and the confirmation that  he did last use heroin yesterday morning, his AMS is likely attributed to drug use. He feels like he is back to baseline. This, paired with no focal neurological deficit, seizure or headache makes me less concerned for an intracranial pathology. Ricky Callahan was again warned about the consequences of drug use and urged him to find help for his problem.  Plan: 1) Neurochecks q4h x 2 to watch for withdrawal symptoms 2) Correction of metabolic abnormalities per primary team 3) Neurology will sign off. Please call with questions.  Bruna Potter PA-C [facility] [phone] 07/20/2017, 11:00 AM

## 2017-07-20 NOTE — Progress Notes (Signed)
[id number] narcan 1mg  per MD order, patient's legs began shaking, patient was alert and oriented and said he was in no pain during the episode. EKG attempted but patient would not remain on his back. Patient spit over the side of the bed during the EKG attempt. Patient more responsive to being spoken to in [place]. MD notified.   Patient let nurse tech do EKG, placed in chart. Marisa Hufstetler P. Tyson Dense, RN

## 2017-07-20 NOTE — Plan of Care (Signed)
Problem: Pain Managment: Goal: General experience of comfort will improve Outcome: Progressing Pt has had no c/o pain or discomfort this shift.  Problem: Physical Regulation: Goal: Ability to maintain clinical measurements within normal limits will improve Outcome: Progressing VSS. Goal: Will remain free from infection Outcome: Progressing No s/s of infection noted.  Problem: Fluid Volume: Goal: Ability to maintain a balanced intake and output will improve Outcome: Progressing IV fluids infusing; good urine output.

## 2017-07-20 NOTE — Progress Notes (Signed)
Pt remains lethargic and minimally responsive.  He does follow simple commands and nods his head appropriately to questions.  Opens eyes when asked to do so.  Pt noted to have occasional intermittent episodes of violent tremors of his BLE, lasting ~10 seconds each.  Rapid response nurse present and suggests checking magnesium level.  Dr. Bruna Potter notified of the above.  Mag level ordered.  Will continue to monitor.  Alonza Bogus

## 2017-07-20 NOTE — Progress Notes (Signed)
PROGRESS NOTE   Ricky Callahan  ZOX:[id number]  DOB: [birth year]  DOA: 07/19/2017 PCP: Patient, No Pcp Per  Brief Admission Hx:  41 y.o. male known heroine addict who presented to the ED with lethargy and dizziness.  He was brought to ED by his boss because he became dizzy and lethargic at work.  He reported took heroin today but not confirmed.  His girlfriend says that he is a chronic user and snorts heroin.    MDM/Assessment & Plan:   1. Acute encephalopathy - only slight improvement since admission, still very lethargic and not speaking, poorly responsive, neuro exam unchanged, IV naloxone infusion was never started as had been ordered at admission.  Will ask for neurology consult.  Continue IVFs and NPO pending SLP evaluation.  Girlfriend says that patient is a daily heroin snorter, but UDS also shows cocaine. I ordered an EEG.  ?repeat brain imaging today, will await neuro recommendations.  I spoke with neuro, ordering an MRI.   2. Dehydration - moderate - continue maintenance IVF hydration. 3. Hypotension - likely secondary to drugs and dehydration, responding well to IVF hydration.  Monitor.  4. Heroin addiction and suspected polysubstance abuse  - consult social worker to speak with patient about treatment when medically stabilized.   5. Hyperglycemia - reactive, normal A1c.  6. Alcoholism - it is not clear how much alcohol he is drinking but will need to monitor for withdrawal. Did not order benzodiazepine coverage at this time given his severe lethargy and encephalopathy.  He could benefit from vitamin therapy when he is more awake and able to take pills.  7. Abnormal EKG - inferior lead T wave inversion possibly from drug, heat stroke, dehydration, will investigate further with serial troponin, continuous telemetry monitoring.    Repeat a 12 lead EKG.    DVT Prophylaxis: enoxaparin Code Status: Full   Family Communication: girlfriend bedside  Disposition Plan:  TBD  Subjective: Pt still lethargic and nonverbal.   Objective: Vitals:   07/19/17 2225 07/19/17 2245 07/20/17 0000 07/20/17 0355  BP: (!) 104/59  98/69 102/67  Pulse:   76 74  Resp: 19  18 16   Temp: 98 F (36.7 C)  98.3 F (36.8 C) 98 F (36.7 C)  TempSrc: Axillary  Axillary Oral  SpO2:   100% 97%  Weight:  76.5 kg (168 lb 10.4 oz)  75.1 kg (165 lb 9.6 oz)  Height:  5\' 10"  (1.778 m)      Intake/Output Summary (Last 24 hours) at 07/20/17 0736 Last data filed at 07/20/17 0232  Gross per 24 hour  Intake           4152.5 ml  Output             1525 ml  Net           2627.5 ml   Filed Weights   07/19/17 2245 07/20/17 0355  Weight: 76.5 kg (168 lb 10.4 oz) 75.1 kg (165 lb 9.6 oz)   REVIEW OF SYSTEMS  As per history otherwise all reviewed and reported negative  Exam:  General exam: lethargic but arousable with a lot of stimulation.   Respiratory system: Clear. No increased work of breathing. Cardiovascular system: S1 & S2 heard, RRR. No JVD, murmurs, gallops, clicks or pedal edema. Gastrointestinal system: Abdomen is nondistended, soft and nontender. Normal bowel sounds heard. Central nervous system: Alert and oriented. No focal neurological deficits. Extremities: no CCE.  Data Reviewed: Basic Metabolic Panel:  Recent Labs Lab  07/19/17 1010 07/20/17 0325  NA 138 144  K 3.9 3.6  CL 103 114*  CO2 25 25  GLUCOSE 165* 94  BUN 11 6  CREATININE 1.02 0.75  CALCIUM 8.9 8.5*  MG  --  2.3   Liver Function Tests:  Recent Labs Lab 07/19/17 1010 07/20/17 0325  AST 22 18  ALT 18 14*  ALKPHOS 57 48  BILITOT 0.5 0.9  PROT 6.9 5.8*  ALBUMIN 3.9 3.2*   No results for input(s): LIPASE, AMYLASE in the last 168 hours. No results for input(s): AMMONIA in the last 168 hours. CBC:  Recent Labs Lab 07/19/17 1010  WBC 9.2  HGB 15.0  HCT 43.8  MCV 82.2  PLT 290   Cardiac Enzymes:  Recent Labs Lab 07/19/17 1610 07/19/17 2303 07/20/17 0325  CKTOTAL 130  --   163  TROPONINI  --  <0.03 <0.03   CBG (last 3)   Recent Labs  07/19/17 1010  GLUCAP 172*   No results found for this or any previous visit (from the past 240 hour(s)).   Studies: Ct Head Wo Contrast  Result Date: 07/19/2017 CLINICAL DATA:  Doing heroin this am, came to ed due fatigue and dizziness x 2 hours EXAM: CT HEAD WITHOUT CONTRAST TECHNIQUE: Contiguous axial images were obtained from the base of the skull through the vertex without intravenous contrast. COMPARISON:  None. FINDINGS: Brain: No evidence of acute infarction, hemorrhage, hydrocephalus, extra-axial collection or mass lesion/mass effect. Vascular: No hyperdense vessel or unexpected calcification. Skull: No osseous abnormality. Sinuses/Orbits: Mild bilateral maxillary sinus mucosal thickening. Visualized mastoid sinuses are clear. Visualized orbits demonstrate no focal abnormality. Other: None IMPRESSION: No acute intracranial pathology. Electronically Signed   By: Elige Ko   On: 07/19/2017 15:52   Scheduled Meds: . enoxaparin (LOVENOX) injection  40 mg Subcutaneous Q24H  . naLOXone (NARCAN)  injection  1 mg Intravenous Once   Continuous Infusions: . sodium chloride 150 mL/hr at 07/20/17 4765    Principal Problem:   Acute encephalopathy Active Problems:   Drug overdose   Polysubstance abuse   Heroin addiction (HCC)   Hypotension   Dehydration, moderate   Hyperglycemia  Time spent:   Standley Dakins, MD, FAAFP [facility] Pager [phone]  If 7PM-7AM, please contact night-coverage [email or web address] Password [email or web address] 07/20/2017, 7:36 AM    LOS: 0 days

## 2017-07-21 MED ORDER — IBUPROFEN 200 MG PO TABS
400.0000 mg | ORAL_TABLET | Freq: Once | ORAL | Status: AC
  Administered 2017-07-21: 400 mg via ORAL
  Filled 2017-07-21: qty 2

## 2017-07-21 MED ORDER — FOLIC ACID 1 MG PO TABS: 1.0000 mg | ORAL_TABLET | Freq: Every day | ORAL | 0 refills | Status: AC

## 2017-07-21 MED ORDER — MULTI-VITAMIN/MINERALS PO TABS: 1.0000 | ORAL_TABLET | Freq: Every day | ORAL | 0 refills | Status: AC

## 2017-07-21 MED ORDER — VITAMIN B-1 100 MG PO TABS: 100.0000 mg | ORAL_TABLET | Freq: Every day | ORAL | 0 refills | Status: AC

## 2017-07-21 MED ORDER — DOCUSATE SODIUM 100 MG PO CAPS: 100.0000 mg | ORAL_CAPSULE | Freq: Two times a day (BID) | ORAL | 2 refills | Status: AC

## 2017-07-21 MED ORDER — LORAZEPAM 1 MG PO TABS
2.0000 mg | ORAL_TABLET | Freq: Once | ORAL | Status: AC
  Administered 2017-07-21: 2 mg via ORAL
  Filled 2017-07-21: qty 2

## 2017-07-21 NOTE — Progress Notes (Signed)
Patient called RN to room several times over the past hour stating he felt he needed something for withdrawals. He complains of tremors, pain and restlessness. He is mildly diaphoretic and agitated intermittently. CIWA currently an 8. NP Blount made aware of symptoms and patient concern. No new orders received at this time. Will continue to monitor closely.

## 2017-07-21 NOTE — Progress Notes (Signed)
Patient received discharge information and acknowledged understanding of it. Patient IVs were removed.  

## 2017-07-21 NOTE — Discharge Summary (Signed)
Physician Discharge Summary  Ricky Callahan ZOX:[id number] DOB: September 26, 1976 DOA: 07/19/2017  PCP: Patient, No Pcp Per  Admit date: 07/19/2017 Discharge date: 07/21/2017  Admitted From: HOME  Disposition: HOME   Recommendations for Outpatient Follow-up:  1. Follow up with [facility] to establish care 2. Seek substance abuse treatment 3. Stop using recreational drugs and alcohol  Discharge Condition: STABLE   CODE STATUS: FULL    Brief Hospitalization Summary: Please see all hospital notes, images, labs for full details of the hospitalization.  HPI: Ricky Callahan is a 41 y.o. male known heroine addict who presented to the ED with lethargy and dizziness.  He was brought to ED by his boss because he became dizzy and lethargic at work.  He reported took heroin today but not confirmed.  His girlfriend says that he is a chronic user and snorts heroin.  He apparently works as a Administrator.  He was noted to be hypotensive on arrival to ED and difficult to arouse and very lethargic. He was able to protect and maintain his airway.  Unable to get history from patient.  He was given IVFs in ED with improvement in the blood pressures but continues to be very lethargic.  He received a small dose of naloxone in ED with some improvement in symptoms but then rapidly becomes lethargic again.  There was concern that he will continue to decline and admission is being requested for observation.  He does not have a fever or signs or symptoms of sepsis. When the patient does briefly wake up he denies that he took anything then he rapidly falls asleep.    Brief Admission Hx: 41 y.o.maleknown heroine addict who presented to the ED with lethargy and dizziness. He was brought to ED by his boss because he became dizzy and lethargic at work. He reported took heroin today but not confirmed. His girlfriend says that he is a chronic user and snorts heroin.   MDM/Assessment & Plan:   1. Acute  encephalopathy - Resolved, now back to baseline.  Eating and drinking well. Vital signs stable. He was strongly advised to stop using recreational drugs and alcohol and verbalized understanding.   Girlfriend says that patient is a daily heroin snorter, but UDS also shows cocaine.  Neurology saw him yesterday and signed off as he was improving after naloxone was given.     2. Dehydration - resolved with IVF hydration. 3. Hypotension - responded well to IVF hydration.  4. Heroin addiction and suspected polysubstance abuse - consulted social worker to speak with patient about treatment when medically stabilized. also consulted care manager to assist him with getting a primary care clinic to follow up with.   5. Hyperglycemia - reactive, normal A1c.  6. Alcoholism - Counseled and offered treatment for alcohol addiction.  I have prescribed him MVI, thiamine and folic acid at discharge.   7. Abnormal EKG - likely related to his chronic polysubstance abuse, he has normal telemetry, no chest pain or SOB.     DVT Prophylaxis:enoxaparin Code Status:Full  Family Communication:girlfriend bedside Disposition Plan:Home   Discharge Diagnoses:  Principal Problem:   Acute encephalopathy Active Problems:   Drug overdose   Polysubstance abuse   Heroin addiction (HCC)   Hypotension   Dehydration, moderate   Hyperglycemia   Encephalopathy  Discharge Instructions: Discharge Instructions    Call MD for:  extreme fatigue    Complete by:  As directed    Call MD for:  persistant dizziness or light-headedness  Complete by:  As directed    Call MD for:  persistant nausea and vomiting    Complete by:  As directed    Call MD for:  severe uncontrolled pain    Complete by:  As directed    Call MD for:  temperature >100.4    Complete by:  As directed    Increase activity slowly    Complete by:  As directed      Allergies as of 07/21/2017      Reactions   Penicillins Rash   Has patient had a PCN  reaction causing immediate rash, facial/tongue/throat swelling, SOB or lightheadedness with hypotension: Yes Has patient had a PCN reaction causing severe rash involving mucus membranes or skin necrosis: No Has patient had a PCN reaction that required hospitalization: No Has patient had a PCN reaction occurring within the last 10 years: Yes If all of the above answers are "NO", then may proceed with Cephalosporin use.   Toradol [ketorolac Tromethamine] Nausea And Vomiting   Vicodin [hydrocodone-acetaminophen] Itching, Rash      Medication List    TAKE these medications   docusate sodium 100 MG capsule Commonly known as:  COLACE Take 1 capsule (100 mg total) by mouth 2 (two) times daily.   folic acid 1 MG tablet Commonly known as:  FOLVITE Take 1 tablet (1 mg total) by mouth daily.   multivitamin with minerals tablet Take 1 tablet by mouth daily.   thiamine 100 MG tablet Commonly known as:  VITAMIN B-1 Take 1 tablet (100 mg total) by mouth daily.      Follow-up Information    [facility]. Schedule an appointment as soon as possible for a visit in 1 week(s).   Specialty:  Family Medicine Why:  Hospital Follow Up establish primary care Contact information: [address] [place] [facility] [address] [phone]         Allergies  Allergen Reactions  . Penicillins Rash    Has patient had a PCN reaction causing immediate rash, facial/tongue/throat swelling, SOB or lightheadedness with hypotension: Yes Has patient had a PCN reaction causing severe rash involving mucus membranes or skin necrosis: No Has patient had a PCN reaction that required hospitalization: No Has patient had a PCN reaction occurring within the last 10 years: Yes If all of the above answers are "NO", then may proceed with Cephalosporin use.   . Toradol [Ketorolac Tromethamine] Nausea And Vomiting  . Vicodin [Hydrocodone-Acetaminophen] Itching and Rash   Current  Discharge Medication List    START taking these medications   Details  docusate sodium (COLACE) 100 MG capsule Take 1 capsule (100 mg total) by mouth 2 (two) times daily. Qty: 60 capsule, Refills: 2    folic acid (FOLVITE) 1 MG tablet Take 1 tablet (1 mg total) by mouth daily. Qty: 30 tablet, Refills: 0    Multiple Vitamins-Minerals (MULTIVITAMIN WITH MINERALS) tablet Take 1 tablet by mouth daily. Qty: 30 tablet, Refills: 0    thiamine (VITAMIN B-1) 100 MG tablet Take 1 tablet (100 mg total) by mouth daily. Qty: 30 tablet, Refills: 0        Procedures/Studies: Ct Head Wo Contrast  Result Date: 07/19/2017 CLINICAL DATA:  Doing heroin this am, came to ed due fatigue and dizziness x 2 hours EXAM: CT HEAD WITHOUT CONTRAST TECHNIQUE: Contiguous axial images were obtained from the base of the skull through the vertex without intravenous contrast. COMPARISON:  None. FINDINGS: Brain: No evidence of acute  infarction, hemorrhage, hydrocephalus, extra-axial collection or mass lesion/mass effect. Vascular: No hyperdense vessel or unexpected calcification. Skull: No osseous abnormality. Sinuses/Orbits: Mild bilateral maxillary sinus mucosal thickening. Visualized mastoid sinuses are clear. Visualized orbits demonstrate no focal abnormality. Other: None IMPRESSION: No acute intracranial pathology. Electronically Signed   By: Elige Ko   On: 07/19/2017 15:52      Subjective: Pt awake alert NAD, eating and drinking well, mental status back to baseline.   Discharge Exam: Vitals:   07/21/17 0600 07/21/17 0637  BP:    Pulse:  61  Resp:    Temp:    SpO2: 97%    Vitals:   07/21/17 0300 07/21/17 0423 07/21/17 0600 07/21/17 0637  BP: 138/73 (!) 145/96    Pulse: 68 (!) 47  61  Resp:      Temp:  98.3 F (36.8 C)    TempSrc:  Axillary    SpO2:  98% 97%   Weight:      Height:       General: Pt is alert, awake, not in acute distress Cardiovascular: RRR, S1/S2 +, no rubs, no  gallops Respiratory: CTA bilaterally, no wheezing, no rhonchi Abdominal: Soft, NT, ND, bowel sounds + Extremities: no edema, no cyanosis   The results of significant diagnostics from this hospitalization (including imaging, microbiology, ancillary and laboratory) are listed below for reference.     Microbiology: Recent Results (from the past 240 hour(s))  Culture, blood (routine x 2)     Status: None (Preliminary result)   Collection Time: 07/19/17 12:45 PM  Result Value Ref Range Status   Specimen Description BLOOD RIGHT ANTECUBITAL  Final   Special Requests   Final    BOTTLES DRAWN AEROBIC AND ANAEROBIC Blood Culture adequate volume   Culture NO GROWTH 1 DAY  Final   Report Status PENDING  Incomplete  Culture, blood (routine x 2)     Status: None (Preliminary result)   Collection Time: 07/19/17 12:45 PM  Result Value Ref Range Status   Specimen Description BLOOD RIGHT HAND  Final   Special Requests IN PEDIATRIC BOTTLE Blood Culture adequate volume  Final   Culture NO GROWTH 1 DAY  Final   Report Status PENDING  Incomplete  MRSA PCR Screening     Status: None   Collection Time: 07/20/17  2:34 AM  Result Value Ref Range Status   MRSA by PCR NEGATIVE NEGATIVE Final    Comment:        The GeneXpert MRSA Assay (FDA approved for NASAL specimens only), is one component of a comprehensive MRSA colonization surveillance program. It is not intended to diagnose MRSA infection nor to guide or monitor treatment for MRSA infections.     Labs: BNP (last 3 results) No results for input(s): BNP in the last 8760 hours. Basic Metabolic Panel:  Recent Labs Lab 07/19/17 1010 07/20/17 0325  NA 138 144  K 3.9 3.6  CL 103 114*  CO2 25 25  GLUCOSE 165* 94  BUN 11 6  CREATININE 1.02 0.75  CALCIUM 8.9 8.5*  MG  --  2.3   Liver Function Tests:  Recent Labs Lab 07/19/17 1010 07/20/17 0325  AST 22 18  ALT 18 14*  ALKPHOS 57 48  BILITOT 0.5 0.9  PROT 6.9 5.8*  ALBUMIN 3.9  3.2*   No results for input(s): LIPASE, AMYLASE in the last 168 hours. No results for input(s): AMMONIA in the last 168 hours. CBC:  Recent Labs Lab 07/19/17 1010 07/20/17 0955  WBC 9.2 7.7  HGB 15.0 13.6  HCT 43.8 41.4  MCV 82.2 83.6  PLT 290 240   Cardiac Enzymes:  Recent Labs Lab 07/19/17 1610 07/19/17 2303 07/20/17 0325 07/20/17 0955  CKTOTAL 130  --  163  --   TROPONINI  --  <0.03 <0.03 <0.03   BNP: Invalid input(s): POCBNP CBG:  Recent Labs Lab 07/19/17 1010  GLUCAP 172*   D-Dimer No results for input(s): DDIMER in the last 72 hours. Hgb A1c  Recent Labs  07/19/17 2303  HGBA1C 5.3   Lipid Profile No results for input(s): CHOL, HDL, LDLCALC, TRIG, CHOLHDL, LDLDIRECT in the last 72 hours. Thyroid function studies  Recent Labs  07/19/17 2303  TSH 0.214*   Anemia work up No results for input(s): VITAMINB12, FOLATE, FERRITIN, TIBC, IRON, RETICCTPCT in the last 72 hours. Urinalysis    Component Value Date/Time   COLORURINE YELLOW 07/19/2017 1905   APPEARANCEUR CLEAR 07/19/2017 1905   LABSPEC 1.006 07/19/2017 1905   PHURINE 6.0 07/19/2017 1905   GLUCOSEU NEGATIVE 07/19/2017 1905   HGBUR NEGATIVE 07/19/2017 1905   BILIRUBINUR NEGATIVE 07/19/2017 1905   KETONESUR NEGATIVE 07/19/2017 1905   PROTEINUR NEGATIVE 07/19/2017 1905   NITRITE NEGATIVE 07/19/2017 1905   LEUKOCYTESUR NEGATIVE 07/19/2017 1905   Sepsis Labs Invalid input(s): PROCALCITONIN,  WBC,  LACTICIDVEN Microbiology Recent Results (from the past 240 hour(s))  Culture, blood (routine x 2)     Status: None (Preliminary result)   Collection Time: 07/19/17 12:45 PM  Result Value Ref Range Status   Specimen Description BLOOD RIGHT ANTECUBITAL  Final   Special Requests   Final    BOTTLES DRAWN AEROBIC AND ANAEROBIC Blood Culture adequate volume   Culture NO GROWTH 1 DAY  Final   Report Status PENDING  Incomplete  Culture, blood (routine x 2)     Status: None (Preliminary result)    Collection Time: 07/19/17 12:45 PM  Result Value Ref Range Status   Specimen Description BLOOD RIGHT HAND  Final   Special Requests IN PEDIATRIC BOTTLE Blood Culture adequate volume  Final   Culture NO GROWTH 1 DAY  Final   Report Status PENDING  Incomplete  MRSA PCR Screening     Status: None   Collection Time: 07/20/17  2:34 AM  Result Value Ref Range Status   MRSA by PCR NEGATIVE NEGATIVE Final    Comment:        The GeneXpert MRSA Assay (FDA approved for NASAL specimens only), is one component of a comprehensive MRSA colonization surveillance program. It is not intended to diagnose MRSA infection nor to guide or monitor treatment for MRSA infections.     Time coordinating discharge: 33 mins  SIGNED:  Standley Dakins, MD  [facility] 07/21/2017, 7:59 AM Pager [phone]  If 7PM-7AM, please contact night-coverage [email or web address] Password [email or web address]

## 2017-07-21 NOTE — Progress Notes (Addendum)
Asked to assess for withdrawal, patient is exhibiting signs of withdrawal, + tactile disturbances, + tremors, diaphoretic, anxiety, pain generlized, mild agitation, restless, subjectively states " I really do not feel good". Patient states he feels like he is starting to go through withdrawal, he states he drinks everyday as well. Admitted for lethargy and dizziness, UDS +cocaine, patient endorses heroin use as well. Currently patient is protecting his airway and is not in any respiratory distress.  HR 40-50s, SBP in the 140s, CIWA 10.   Tough situation since patient required Narcan yesterday and was lethargic during most of the day yesterday.  Paged [facility] NP about patient for guidance, received order for Ativan 2mg  PO.  Will follow, please contact RR RN if withdrawal symptoms worsen.

## 2017-07-21 NOTE — Discharge Instructions (Signed)
Follow with Primary MD  Patient, No Pcp Per  and other consultant's as instructed your Hospitalist MD ° °Please get a complete blood count and chemistry panel checked by your Primary MD at your next visit, and again as instructed by your Primary MD. ° °Get Medicines reviewed and adjusted: °Please take all your medications with you for your next visit with your Primary MD ° °Laboratory/radiological data: °Please request your Primary MD to go over all hospital tests and procedure/radiological results at the follow up, please ask your Primary MD to get all Hospital records sent to his/her office. ° °In some cases, they will be blood work, cultures and biopsy results pending at the time of your discharge. Please request that your primary care M.D. follows up on these results. ° °Also Note the following: °If you experience worsening of your admission symptoms, develop shortness of breath, life threatening emergency, suicidal or homicidal thoughts you must seek medical attention immediately by calling 911 or calling your MD immediately  if symptoms less severe. ° °You must read complete instructions/literature along with all the possible adverse reactions/side effects for all the Medicines you take and that have been prescribed to you. Take any new Medicines after you have completely understood and accpet all the possible adverse reactions/side effects.  ° °Do not drive when taking Pain medications or sleeping medications (Benzodaizepines) ° °Do not take more than prescribed Pain, Sleep and Anxiety Medications. It is not advisable to combine anxiety,sleep and pain medications without talking with your primary care practitioner ° °Special Instructions: If you have smoked or chewed Tobacco  in the last 2 yrs please stop smoking, stop any regular Alcohol  and or any Recreational drug use. ° °Wear Seat belts while driving. ° °Please note: °You were cared for by a hospitalist during your hospital stay. Once you are discharged,  your primary care physician will handle any further medical issues. Please note that NO REFILLS for any discharge medications will be authorized once you are discharged, as it is imperative that you return to your primary care physician (or establish a relationship with a primary care physician if you do not have one) for your post hospital discharge needs so that they can reassess your need for medications and monitor your lab values. ° ° ° ° °

## 2017-07-24 LAB — CULTURE, BLOOD (ROUTINE X 2)
CULTURE: NO GROWTH
CULTURE: NO GROWTH
SPECIAL REQUESTS: ADEQUATE
SPECIAL REQUESTS: ADEQUATE

## 2017-08-19 ENCOUNTER — Emergency Department (HOSPITAL_COMMUNITY)
Admission: EM | Admit: 2017-08-19 | Discharge: 2017-08-20 | Disposition: A | Discharge status: Home / Self Care | Payer: Self-pay | Attending: Emergency Medicine | Admitting: Emergency Medicine

## 2017-08-19 ENCOUNTER — Encounter (HOSPITAL_COMMUNITY): Payer: Self-pay | Admitting: Emergency Medicine

## 2017-08-19 DIAGNOSIS — F1910 Other psychoactive substance abuse, uncomplicated: Secondary | ICD-10-CM | POA: Insufficient documentation

## 2017-08-19 DIAGNOSIS — Y999 Unspecified external cause status: Secondary | ICD-10-CM | POA: Insufficient documentation

## 2017-08-19 DIAGNOSIS — Y92810 Car as the place of occurrence of the external cause: Secondary | ICD-10-CM | POA: Insufficient documentation

## 2017-08-19 DIAGNOSIS — T510X1A Toxic effect of ethanol, accidental (unintentional), initial encounter: Secondary | ICD-10-CM | POA: Insufficient documentation

## 2017-08-19 DIAGNOSIS — Y939 Activity, unspecified: Secondary | ICD-10-CM | POA: Insufficient documentation

## 2017-08-19 DIAGNOSIS — T401X1A Poisoning by heroin, accidental (unintentional), initial encounter: Secondary | ICD-10-CM | POA: Insufficient documentation

## 2017-08-19 LAB — CBG MONITORING, ED: Glucose-Capillary: 96 mg/dL (ref 65–99)

## 2017-08-19 MED ORDER — LACTATED RINGERS IV BOLUS (SEPSIS)
1000.0000 mL | Freq: Once | INTRAVENOUS | Status: AC
  Administered 2017-08-19: 1000 mL via INTRAVENOUS

## 2017-08-19 MED ORDER — ONDANSETRON 4 MG PO TBDP
8.0000 mg | ORAL_TABLET | Freq: Once | ORAL | Status: AC
  Administered 2017-08-19: 8 mg via ORAL
  Filled 2017-08-19: qty 2

## 2017-08-19 NOTE — ED Triage Notes (Signed)
Patient found in car, unresponsive after using heroin.  Patient is a daily user, has been drinking all day at this time.  Patient was given  intranasal narcan by [facility] and was being bagged upon EMS arrival.  Patient was given another  narcan intranasally by [facility].  Patient remained apneic for about 15 mins and being bagged into the ED.  Upon arrival, patient is CAOx4, sleepy but appropriate.  Patient vomited appox 1L of emesis upon arrival.

## 2017-08-19 NOTE — ED Provider Notes (Signed)
[facility]-EMERGENCY DEPT Provider Note   CSN: [id number] Arrival date & time: 08/19/17  2057   History   Chief Complaint Chief Complaint  Patient presents with  . Drug Overdose   HPI Davidson Tomes is a 41 y.o. male.  This is a 46 year old patient with history of alcohol and illicit drug use who apparently was found by EMS in his car and unresponsive.  Patient states he is a daily heroin user and states he is also been drinking alcohol today.  He was given 2 mg intranasal Narcan by the fire department and was actually being ventilated via BVM by EMS.  He was given another 2 mg Narcan intranasally by EMS.  Patient was apneic for about 15 minutes with ventilation however upon arrival to the ED the patient was lethargic and alert and oriented.  He had one episode of emesis upon arrival.   The history is provided by the patient and the EMS personnel. The history is limited by the condition of the patient.    Past Medical History:  Diagnosis Date  . Cataract   . Gout     Patient Active Problem List   Diagnosis Date Noted  . Encephalopathy 07/20/2017  . Acute encephalopathy 07/19/2017  . Drug overdose 07/19/2017  . Polysubstance abuse 07/19/2017  . Heroin addiction (HCC) 07/19/2017  . Hypotension 07/19/2017  . Hyperglycemia 07/19/2017  . Dehydration, moderate 07/19/2017    Past Surgical History:  Procedure Laterality Date  . CATARACT EXTRACTION         Home Medications    Prior to Admission medications   Medication Sig Start Date End Date Taking? Authorizing Provider  docusate sodium (COLACE) 100 MG capsule Take 1 capsule (100 mg total) by mouth 2 (two) times daily. 07/21/17 07/21/18  Johnson, Clanford L, MD  folic acid (FOLVITE) 1 MG tablet Take 1 tablet (1 mg total) by mouth daily. 07/21/17 08/20/17  Cleora Fleet, MD  Multiple Vitamins-Minerals (MULTIVITAMIN WITH MINERALS) tablet Take 1 tablet by mouth daily. 07/21/17 08/20/17  Johnson, Clanford L, MD  naloxone [facility])  nasal spray 4 mg/0.1 mL Place 1 spray into the nose once. 08/20/17 08/20/17  Shaune Pollack, MD  thiamine (VITAMIN B-1) 100 MG tablet Take 1 tablet (100 mg total) by mouth daily. 07/21/17 08/20/17  Cleora Fleet, MD    Family History No family history on file.  Social History Social History  Substance Use Topics  . Smoking status: Never Smoker  . Smokeless tobacco: Never Used  . Alcohol use Yes     Allergies   Penicillins; Toradol [ketorolac tromethamine]; and Vicodin [hydrocodone-acetaminophen]   Review of Systems Review of Systems  Unable to perform ROS: Acuity of condition     Physical Exam Updated Vital Signs BP 126/73   Pulse (!) 101   Temp 98.2 F (36.8 C) (Oral)   Resp 14   SpO2 93%   Physical Exam  Constitutional: He is oriented to person, place, and time. He appears well-developed and well-nourished. He appears lethargic. He is cooperative. No distress.  HENT:  Head: Normocephalic and atraumatic.  Eyes: Pupils are equal, round, and reactive to light. Conjunctivae are normal.  Neck: Normal range of motion. Neck supple.  Cardiovascular: Normal rate, regular rhythm, normal heart sounds and intact distal pulses.   No murmur heard. Pulmonary/Chest: Effort normal and breath sounds normal. No respiratory distress.  Abdominal: Soft. There is no tenderness.  Musculoskeletal: Normal range of motion. He exhibits no edema.  Neurological: He is oriented to  person, place, and time. He has normal strength. He appears lethargic. He displays no tremor. No cranial nerve deficit or sensory deficit. He exhibits normal muscle tone. He displays no seizure activity. Coordination normal.  Skin: Skin is warm. Capillary refill takes less than 2 seconds. No rash noted. He is diaphoretic. No erythema.  Psychiatric: He has a normal mood and affect.  Nursing note and vitals reviewed.    ED Treatments / Results  Labs (all labs ordered are listed, but only abnormal results are  displayed) Labs Reviewed  CBG MONITORING, ED    EKG  EKG Interpretation  Date/Time:  Monday August 19 2017 21:19:56 EDT Ventricular Rate:  94 PR Interval:    QRS Duration: 80 QT Interval:  337 QTC Calculation: 422 R Axis:   66 Text Interpretation:  Sinus rhythm No significant change since last tracing Confirmed by Marily Memos [phone]) on 08/19/2017 9:27:23 PM       Radiology No results found.  Procedures Procedures (including critical care time)  Medications Ordered in ED Medications  ondansetron (ZOFRAN-ODT) disintegrating tablet 8 mg (8 mg Oral Given 08/19/17 2302)  lactated ringers bolus 1,000 mL (0 mLs Intravenous Stopped 08/19/17 2352)     Initial Impression / Assessment and Plan / ED Course  I have reviewed the triage vital signs and the nursing notes.  Pertinent labs & imaging results that were available during my care of the patient were reviewed by me and considered in my medical decision making (see chart for details).     This is a 41 year old patient with history of alcohol and illicit drug use who apparently was found by EMS in his car and unresponsive.   Upon arrival patient lethargic however oriented 4.  Cooperative with neurological exam which showed no focal deficits.  EKG performed and showed NSR. CBG 96.  Patient observed in the ED on end tidal capnography. Given zofran for nausea.  Patient tolerated PO intake and ambulated prior to discharge.  Rx written for Narcan nasal spray and instructions on use.  Final Clinical Impressions(s) / ED Diagnoses   Final diagnoses:  Accidental overdose of heroin, initial encounter  Polysubstance abuse  Toxic effect of ethanol, unintentional, initial encounter    New Prescriptions New Prescriptions   NALOXONE (NARCAN) NASAL SPRAY 4 MG/0.1 ML    Place 1 spray into the nose once.     Shaune Pollack, MD 08/20/17 Burna Mortimer    Marily Memos, MD 08/20/17 [phone]

## 2017-08-20 MED ORDER — NALOXONE HCL 4 MG/0.1ML NA LIQD: 1.0000 | Freq: Once | NASAL | 0 refills | Status: AC

## 2017-08-20 MED ORDER — ONDANSETRON HCL 4 MG/2ML IJ SOLN
4.0000 mg | Freq: Once | INTRAMUSCULAR | Status: AC
  Administered 2017-08-20: 4 mg via INTRAVENOUS
  Filled 2017-08-20: qty 2

## 2018-01-08 ENCOUNTER — Emergency Department (HOSPITAL_COMMUNITY)
Admission: EM | Admit: 2018-01-08 | Discharge: 2018-01-08 | Disposition: A | Discharge status: Home / Self Care | Payer: Self-pay | Attending: Emergency Medicine | Admitting: Emergency Medicine

## 2018-01-08 ENCOUNTER — Emergency Department (HOSPITAL_COMMUNITY): Payer: Self-pay

## 2018-01-08 ENCOUNTER — Encounter (HOSPITAL_COMMUNITY): Payer: Self-pay | Admitting: Internal Medicine

## 2018-01-08 DIAGNOSIS — F1910 Other psychoactive substance abuse, uncomplicated: Secondary | ICD-10-CM | POA: Insufficient documentation

## 2018-01-08 DIAGNOSIS — R4182 Altered mental status, unspecified: Secondary | ICD-10-CM | POA: Insufficient documentation

## 2018-01-08 LAB — I-STAT CHEM 8, ED
BUN: 19 mg/dL (ref 6–20)
CREATININE: 0.8 mg/dL (ref 0.61–1.24)
Calcium, Ion: 1.05 mmol/L — ABNORMAL LOW (ref 1.15–1.40)
Chloride: 107 mmol/L (ref 101–111)
Glucose, Bld: 96 mg/dL (ref 65–99)
HEMATOCRIT: 47 % (ref 39.0–52.0)
HEMOGLOBIN: 16 g/dL (ref 13.0–17.0)
Potassium: 6.5 mmol/L (ref 3.5–5.1)
Sodium: 139 mmol/L (ref 135–145)
TCO2: 28 mmol/L (ref 22–32)

## 2018-01-08 LAB — COMPREHENSIVE METABOLIC PANEL
ALK PHOS: 68 U/L (ref 38–126)
ALT: 22 U/L (ref 17–63)
AST: 26 U/L (ref 15–41)
Albumin: 4.2 g/dL (ref 3.5–5.0)
Anion gap: 7 (ref 5–15)
BILIRUBIN TOTAL: 0.6 mg/dL (ref 0.3–1.2)
BUN: 14 mg/dL (ref 6–20)
CALCIUM: 8.7 mg/dL — AB (ref 8.9–10.3)
CO2: 24 mmol/L (ref 22–32)
Chloride: 109 mmol/L (ref 101–111)
Creatinine, Ser: 0.68 mg/dL (ref 0.61–1.24)
Glucose, Bld: 102 mg/dL — ABNORMAL HIGH (ref 65–99)
Potassium: 3.7 mmol/L (ref 3.5–5.1)
Sodium: 140 mmol/L (ref 135–145)
TOTAL PROTEIN: 7.1 g/dL (ref 6.5–8.1)

## 2018-01-08 LAB — URINALYSIS, ROUTINE W REFLEX MICROSCOPIC
Bilirubin Urine: NEGATIVE
Glucose, UA: NEGATIVE mg/dL
HGB URINE DIPSTICK: NEGATIVE
Ketones, ur: NEGATIVE mg/dL
Leukocytes, UA: NEGATIVE
NITRITE: NEGATIVE
Protein, ur: 30 mg/dL — AB
RBC / HPF: NONE SEEN RBC/hpf (ref 0–5)
SPECIFIC GRAVITY, URINE: 1.021 (ref 1.005–1.030)
WBC, UA: NONE SEEN WBC/hpf (ref 0–5)
pH: 7 (ref 5.0–8.0)

## 2018-01-08 LAB — CBC WITH DIFFERENTIAL/PLATELET
BASOS PCT: 0 %
Basophils Absolute: 0 10*3/uL (ref 0.0–0.1)
EOS ABS: 0 10*3/uL (ref 0.0–0.7)
EOS PCT: 0 %
HCT: 45.8 % (ref 39.0–52.0)
Hemoglobin: 15.7 g/dL (ref 13.0–17.0)
Lymphocytes Relative: 7 %
Lymphs Abs: 0.9 10*3/uL (ref 0.7–4.0)
MCH: 28 pg (ref 26.0–34.0)
MCHC: 34.3 g/dL (ref 30.0–36.0)
MCV: 81.6 fL (ref 78.0–100.0)
MONO ABS: 0.4 10*3/uL (ref 0.1–1.0)
Monocytes Relative: 3 %
Neutro Abs: 11 10*3/uL — ABNORMAL HIGH (ref 1.7–7.7)
Neutrophils Relative %: 90 %
PLATELETS: 318 10*3/uL (ref 150–400)
RBC: 5.61 MIL/uL (ref 4.22–5.81)
RDW: 14 % (ref 11.5–15.5)
WBC: 12.3 10*3/uL — ABNORMAL HIGH (ref 4.0–10.5)

## 2018-01-08 LAB — CK: CK TOTAL: 126 U/L (ref 49–397)

## 2018-01-08 LAB — I-STAT TROPONIN, ED: Troponin i, poc: 0 ng/mL (ref 0.00–0.08)

## 2018-01-08 LAB — RAPID URINE DRUG SCREEN, HOSP PERFORMED
AMPHETAMINES: NOT DETECTED
BENZODIAZEPINES: NOT DETECTED
Barbiturates: NOT DETECTED
COCAINE: NOT DETECTED
OPIATES: POSITIVE — AB
TETRAHYDROCANNABINOL: NOT DETECTED

## 2018-01-08 MED ORDER — LORAZEPAM 2 MG/ML IJ SOLN
1.0000 mg | Freq: Once | INTRAMUSCULAR | Status: AC
  Administered 2018-01-08: 1 mg via INTRAVENOUS
  Filled 2018-01-08: qty 1

## 2018-01-08 NOTE — ED Notes (Addendum)
Sheriff responsible for securing patient walked to nurses station to ask a question. During this time, handcuffed patient got out of bed by himself and stood at the door. [facility] and [facility]. Per security staff, patient hit head on ground when he was taken down. No obvious injuries noted and patient is not complaining of head pain at this time.

## 2018-01-08 NOTE — ED Notes (Addendum)
[facility] in leg shackles and handcuffs. Patient will be handcuffed to the bathroom rail during visit to restroom.

## 2018-01-08 NOTE — ED Notes (Signed)
Need another light green.

## 2018-01-08 NOTE — ED Notes (Signed)
ED Provider at bedside. 

## 2018-01-08 NOTE — ED Provider Notes (Addendum)
[facility]-EMERGENCY DEPT Provider Note   CSN: [id number] Arrival date & time: 01/08/18  1344     History   Chief Complaint Chief Complaint  Patient presents with  . Drug Overdose    HPI Ricky Callahan is a 42 y.o. male.  Patient was running from the police and then passed out and they stated that he was not breathing so they gave him Narcan and he woke up.   The history is provided by the police and the patient.  Drug Overdose  This is a new problem. The current episode started 1 to 2 hours ago. The problem occurs hourly. The problem has been resolved. Pertinent negatives include no chest pain. Nothing aggravates the symptoms. Nothing relieves the symptoms. The treatment provided no relief.    History reviewed. No pertinent past medical history.  There are no active problems to display for this patient.   History reviewed. No pertinent surgical history.     Home Medications    Prior to Admission medications   Not on File    Family History No family history on file.  Social History Social History   Tobacco Use  . Smoking status: Not on file  Substance Use Topics  . Alcohol use: Not on file  . Drug use: Not on file     Allergies   Patient has no allergy information on record.   Review of Systems Review of Systems  Unable to perform ROS: Mental status change  Cardiovascular: Negative for chest pain.     Physical Exam Updated Vital Signs BP 127/87   Pulse (!) 115   Temp 99 F (37.2 C) (Oral)   SpO2 99%   Physical Exam  Constitutional: He appears well-developed.  HENT:  Head: Normocephalic.  Eyes: Conjunctivae and EOM are normal. No scleral icterus.  Neck: Neck supple. No thyromegaly present.  Cardiovascular: Normal rate and regular rhythm. Exam reveals no gallop and no friction rub.  No murmur heard. Pulmonary/Chest: No stridor. He has no wheezes. He has no rales. He exhibits no tenderness.  Abdominal: He  exhibits no distension. There is no tenderness. There is no rebound.  Musculoskeletal: Normal range of motion. He exhibits no edema.  Lymphadenopathy:    He has no cervical adenopathy.  Neurological: He is alert. He exhibits normal muscle tone. Coordination normal.  Patient alert but initially would not answer questions  Skin: No rash noted. No erythema.     ED Treatments / Results  Labs (all labs ordered are listed, but only abnormal results are displayed) Labs Reviewed  CBC WITH DIFFERENTIAL/PLATELET - Abnormal; Notable for the following components:      Result Value   WBC 12.3 (*)    Neutro Abs 11.0 (*)    All other components within normal limits  URINALYSIS, ROUTINE W REFLEX MICROSCOPIC - Abnormal; Notable for the following components:   APPearance TURBID (*)    Protein, ur 30 (*)    Bacteria, UA RARE (*)    Squamous Epithelial / LPF 0-5 (*)    All other components within normal limits  RAPID URINE DRUG SCREEN, HOSP PERFORMED - Abnormal; Notable for the following components:   Opiates POSITIVE (*)    All other components within normal limits  I-STAT CHEM 8, ED - Abnormal; Notable for the following components:   Potassium 6.5 (*)    Calcium, Ion 1.05 (*)    All other components within normal limits  COMPREHENSIVE METABOLIC PANEL  I-STAT TROPONIN, ED  EKG  EKG Interpretation None       EKG Interpretation  Date/Time:    Ventricular Rate:    PR Interval:    QRS Duration:   QT Interval:    QTC Calculation:   R Axis:     Text Interpretation:         Radiology Dg Chest Port 1 View  Result Date: 01/08/2018 CLINICAL DATA:  Drug overdose today. EXAM: PORTABLE CHEST 1 VIEW COMPARISON:  None. FINDINGS: Lungs are clear. Heart size is normal. No pneumothorax or pleural fluid. No bony abnormality. IMPRESSION: Negative chest. Electronically Signed   By: Drusilla Kannerhomas  Dalessio M.D.   On: 01/08/2018 15:40    Procedures Procedures (including critical care  time)  Medications Ordered in ED Medications  LORazepam (ATIVAN) injection 1 mg (1 mg Intravenous Given 01/08/18 1423)     Initial Impression / Assessment and Plan / ED Course  I have reviewed the triage vital signs and the nursing notes.  Pertinent labs & imaging results that were available during my care of the patient were reviewed by me and considered in my medical decision making (see chart for details). CRITICAL CARE Performed by: Bethann BerkshireJoseph Armen Waring Total critical care time: 35 minutes Critical care time was exclusive of separately billable procedures and treating other patients. Critical care was necessary to treat or prevent imminent or life-threatening deterioration. Critical care was time spent personally by me on the following activities: development of treatment plan with patient and/or surrogate as well as nursing, discussions with consultants, evaluation of patient's response to treatment, examination of patient, obtaining history from patient or surrogate, ordering and performing treatments and interventions, ordering and review of laboratory studies, ordering and review of radiographic studies, pulse oximetry and re-evaluation of patient's condition.   Patient was given Ativan and stopped screaming.  He was able to answer questions appropriately.  At one point the patient ran out of his room and was chased by the police he fell and hit his head no loss of consciousness.  Patient's labs unremarkable except for the potassium is elevated so we will repeat that.  EKG normal.  Urine drug screen did show to be positive for opiates.    Final Clinical Impressions(s) / ED Diagnoses   Final diagnoses:  None    ED Discharge Orders    None       Bethann BerkshireZammit, Shondra Capps, MD 01/08/18 1621    Bethann BerkshireZammit, Franchon Ketterman, MD 01/16/18 [phone]

## 2018-01-08 NOTE — ED Notes (Signed)
Sheriff's Department at the bedside.

## 2018-01-08 NOTE — ED Notes (Signed)
Pt screaming loudly and refusing to answer questions for this RN, the sheriff's department, and for EDP.

## 2018-01-08 NOTE — ED Triage Notes (Signed)
[facility]. [facility]. Pt got out of car and the officers pursued him by foot and proceeded to tackle him to the ground. Pt was put in back of police car. Around 15 minutes later pt began complaining of feeling "hot and sick." Pt then became unresponsive and apneic. 4mg  [facility]an [facility]. Patient awake and responsive for this RN. Vital signs are stable.

## 2018-01-08 NOTE — ED Provider Notes (Addendum)
level5.  Caveat patient is fluent in [place] and does not require interpreter.assumed care from Dr. Agapito Games 4:30 PM history is obtained from law enforcement who accompanies patient and from patient.  Patient was stopped for a traffic stop approximately 12 noon today.  He took off running out of his car Emergency planning/management officer tackled him.  While in police custody he briefly lost consciousness.  He was administered Narcan and regained consciousness.  He was subsequently brought here.  While here patient went running out of his room and was tackled again by police.  He presently states he does not feel well but denies pain anywhere.  He admits to snorting heroin last night.  He denies other drug use. Past medical history negative On exam alert Glasgow Coma Score 15 appears in no distress HEENT exam normocephalic atraumatic neck is supple trachea midline lungs clear to auscultation heart regular rate and rhythm abdomen nondistended nontender.  Pelvis stable nontender.  Entire spine is nontender.  All 4 extremities or contusion abrasion or tenderness neurovascular intact neurologic Glasgow Coma Score 15 cranial nerves II through XII grossly intact moves all extremities well.  6:20 PM patient states "I feel good" he is alert ambulates assist Glasgow Coma Score 15.  He is in police custody referral resource guide Results for orders placed or performed during the hospital encounter of 01/08/18  CBC with Differential/Platelet  Result Value Ref Range   WBC 12.3 (H) 4.0 - 10.5 K/uL   RBC 5.61 4.22 - 5.81 MIL/uL   Hemoglobin 15.7 13.0 - 17.0 g/dL   HCT 65.7 84.6 - 96.2 %   MCV 81.6 78.0 - 100.0 fL   MCH 28.0 26.0 - 34.0 pg   MCHC 34.3 30.0 - 36.0 g/dL   RDW 95.2 84.1 - 32.4 %   Platelets 318 150 - 400 K/uL   Neutrophils Relative % 90 %   Neutro Abs 11.0 (H) 1.7 - 7.7 K/uL   Lymphocytes Relative 7 %   Lymphs Abs 0.9 0.7 - 4.0 K/uL   Monocytes Relative 3 %   Monocytes Absolute 0.4 0.1 - 1.0 K/uL   Eosinophils  Relative 0 %   Eosinophils Absolute 0.0 0.0 - 0.7 K/uL   Basophils Relative 0 %   Basophils Absolute 0.0 0.0 - 0.1 K/uL  Urinalysis, Routine w reflex microscopic  Result Value Ref Range   Color, Urine YELLOW YELLOW   APPearance TURBID (A) CLEAR   Specific Gravity, Urine 1.021 1.005 - 1.030   pH 7.0 5.0 - 8.0   Glucose, UA NEGATIVE NEGATIVE mg/dL   Hgb urine dipstick NEGATIVE NEGATIVE   Bilirubin Urine NEGATIVE NEGATIVE   Ketones, ur NEGATIVE NEGATIVE mg/dL   Protein, ur 30 (A) NEGATIVE mg/dL   Nitrite NEGATIVE NEGATIVE   Leukocytes, UA NEGATIVE NEGATIVE   RBC / HPF NONE SEEN 0 - 5 RBC/hpf   WBC, UA NONE SEEN 0 - 5 WBC/hpf   Bacteria, UA RARE (A) NONE SEEN   Squamous Epithelial / LPF 0-5 (A) NONE SEEN   Mucus PRESENT    Amorphous Crystal PRESENT   Rapid urine drug screen (hospital performed)  Result Value Ref Range   Opiates POSITIVE (A) NONE DETECTED   Cocaine NONE DETECTED NONE DETECTED   Benzodiazepines NONE DETECTED NONE DETECTED   Amphetamines NONE DETECTED NONE DETECTED   Tetrahydrocannabinol NONE DETECTED NONE DETECTED   Barbiturates NONE DETECTED NONE DETECTED  Comprehensive metabolic panel  Result Value Ref Range   Sodium 140 135 - 145 mmol/L  Potassium 3.7 3.5 - 5.1 mmol/L   Chloride 109 101 - 111 mmol/L   CO2 24 22 - 32 mmol/L   Glucose, Bld 102 (H) 65 - 99 mg/dL   BUN 14 6 - 20 mg/dL   Creatinine, Ser 4.09 0.61 - 1.24 mg/dL   Calcium 8.7 (L) 8.9 - 10.3 mg/dL   Total Protein 7.1 6.5 - 8.1 g/dL   Albumin 4.2 3.5 - 5.0 g/dL   AST 26 15 - 41 U/L   ALT 22 17 - 63 U/L   Alkaline Phosphatase 68 38 - 126 U/L   Total Bilirubin 0.6 0.3 - 1.2 mg/dL   GFR calc non Af Amer >60 >60 mL/min   GFR calc Af Amer >60 >60 mL/min   Anion gap 7 5 - 15  CK  Result Value Ref Range   Total CK 126 49 - 397 U/L  I-stat chem 8, ed  Result Value Ref Range   Sodium 139 135 - 145 mmol/L   Potassium 6.5 (HH) 3.5 - 5.1 mmol/L   Chloride 107 101 - 111 mmol/L   BUN 19 6 - 20 mg/dL    Creatinine, Ser 8.11 0.61 - 1.24 mg/dL   Glucose, Bld 96 65 - 99 mg/dL   Calcium, Ion 9.14 (L) 1.15 - 1.40 mmol/L   TCO2 28 22 - 32 mmol/L   Hemoglobin 16.0 13.0 - 17.0 g/dL   HCT 78.2 95.6 - 21.3 %   Comment NOTIFIED PHYSICIAN   I-stat troponin, ED  Result Value Ref Range   Troponin i, poc 0.00 0.00 - 0.08 ng/mL   Comment 3           Ct Head Wo Contrast  Result Date: 01/08/2018 CLINICAL DATA:  Patient tried to escape police custody. Hit head on ground. Evaluate for craniocervical injury. EXAM: CT HEAD WITHOUT CONTRAST CT CERVICAL SPINE WITHOUT CONTRAST TECHNIQUE: Multidetector CT imaging of the head and cervical spine was performed following the standard protocol without intravenous contrast. Multiplanar CT image reconstructions of the cervical spine were also generated. COMPARISON:  None. FINDINGS: CT HEAD FINDINGS Brain: No evidence of acute infarction, hemorrhage, hydrocephalus, extra-axial collection or mass lesion/mass effect. Vascular: No hyperdense vessel or unexpected calcification. Skull: Normal. Negative for fracture or focal lesion. Sinuses/Orbits: No acute finding. Other: None. CT CERVICAL SPINE FINDINGS Alignment: Normal. Skull base and vertebrae: No acute fracture. No primary bone lesion or focal pathologic process. Soft tissues and spinal canal: No prevertebral fluid or swelling. No visible canal hematoma. Disc levels:  No visible disc pathology. Upper chest: Negative. Other: None. IMPRESSION: No skull fracture or intracranial hemorrhage. No cervical spine fracture or traumatic subluxation. Electronically Signed   By: Elsie Stain M.D.   On: 01/08/2018 17:21   Ct Cervical Spine Wo Contrast  Result Date: 01/08/2018 CLINICAL DATA:  Patient tried to escape police custody. Hit head on ground. Evaluate for craniocervical injury. EXAM: CT HEAD WITHOUT CONTRAST CT CERVICAL SPINE WITHOUT CONTRAST TECHNIQUE: Multidetector CT imaging of the head and cervical spine was performed following  the standard protocol without intravenous contrast. Multiplanar CT image reconstructions of the cervical spine were also generated. COMPARISON:  None. FINDINGS: CT HEAD FINDINGS Brain: No evidence of acute infarction, hemorrhage, hydrocephalus, extra-axial collection or mass lesion/mass effect. Vascular: No hyperdense vessel or unexpected calcification. Skull: Normal. Negative for fracture or focal lesion. Sinuses/Orbits: No acute finding. Other: None. CT CERVICAL SPINE FINDINGS Alignment: Normal. Skull base and vertebrae: No acute fracture. No primary bone lesion or focal pathologic  process. Soft tissues and spinal canal: No prevertebral fluid or swelling. No visible canal hematoma. Disc levels:  No visible disc pathology. Upper chest: Negative. Other: None. IMPRESSION: No skull fracture or intracranial hemorrhage. No cervical spine fracture or traumatic subluxation. Electronically Signed   By: Elsie StainJohn T Curnes M.D.   On: 01/08/2018 17:21   Dg Chest Port 1 View  Result Date: 01/08/2018 CLINICAL DATA:  Drug overdose today. EXAM: PORTABLE CHEST 1 VIEW COMPARISON:  None. FINDINGS: Lungs are clear. Heart size is normal. No pneumothorax or pleural fluid. No bony abnormality. IMPRESSION: Negative chest. Electronically Signed   By: Drusilla Kannerhomas  Dalessio M.D.   On: 01/08/2018 15:40     Doug SouJacubowitz, Briar Sword, MD 01/08/18 [id number]    Doug SouJacubowitz, Faline Langer, MD 01/08/18 [phone]

## 2018-01-08 NOTE — ED Notes (Signed)
Bed: [id number] Expected date:  Expected time:  Means of arrival:  Comments: EMS/heroin O.D.

## 2018-01-08 NOTE — Discharge Instructions (Signed)
Call any of the numbers listed to get help with your drug problem

## 2018-01-10 ENCOUNTER — Encounter (HOSPITAL_COMMUNITY): Payer: Self-pay | Admitting: Emergency Medicine

## 2019-02-03 IMAGING — CT CT HEAD W/O CM
4 series · 16 of 47 positions shown, 18 images · non-contrast
Comparison: None.

CLINICAL DATA: Doing heroin this am, came to ed due fatigue and
dizziness x 2 hours

EXAM:
CT HEAD WITHOUT CONTRAST
TECHNIQUE: Contiguous axial images were obtained from the base of the skull
through the vertex without intravenous contrast.

[Series 3: head without · axial · non-contrast · 0.44mm/px · z∈[-146,-26]mm · 7 of 34 slices shown, 9 images]
[im 5/34  brain]
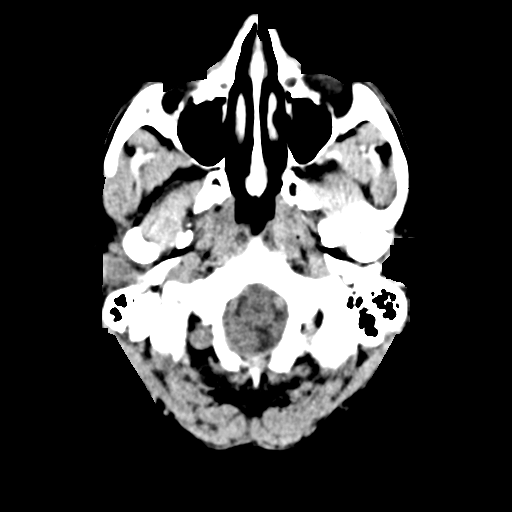
[im 5/34  bone]
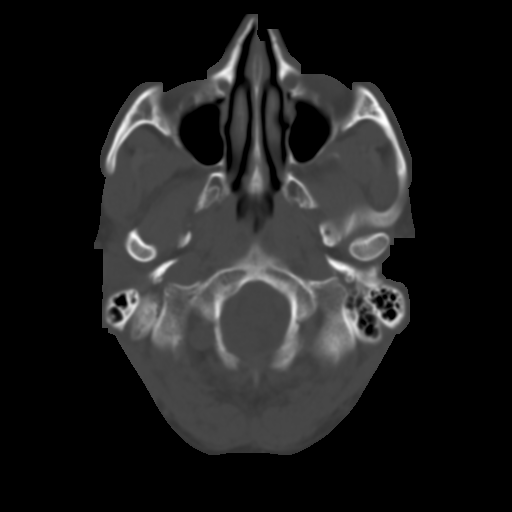
[im 9/34  brain]
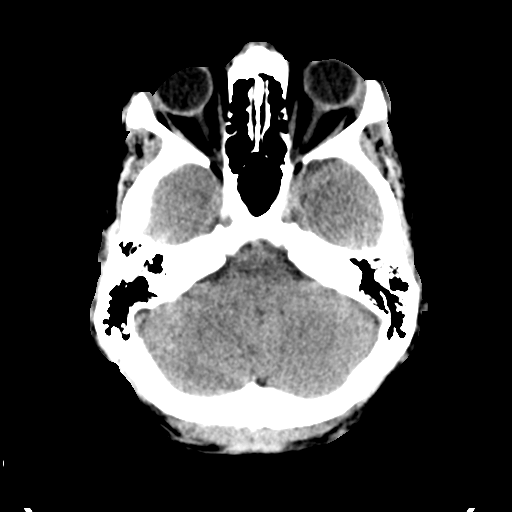
[im 13/34  brain]
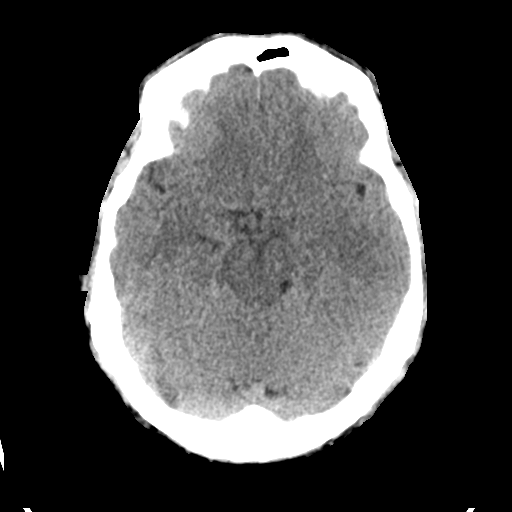
[im 17/34  brain]
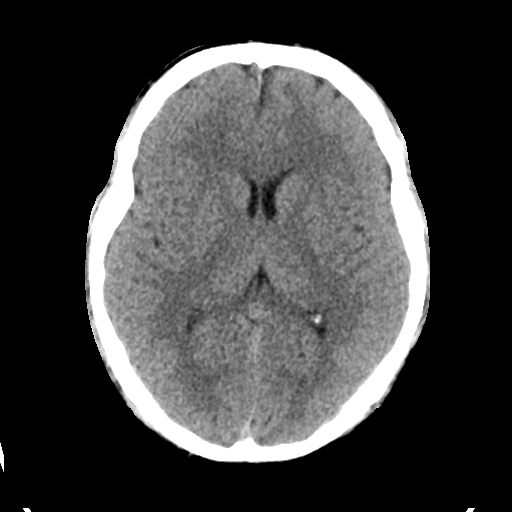
[im 21/34  brain]
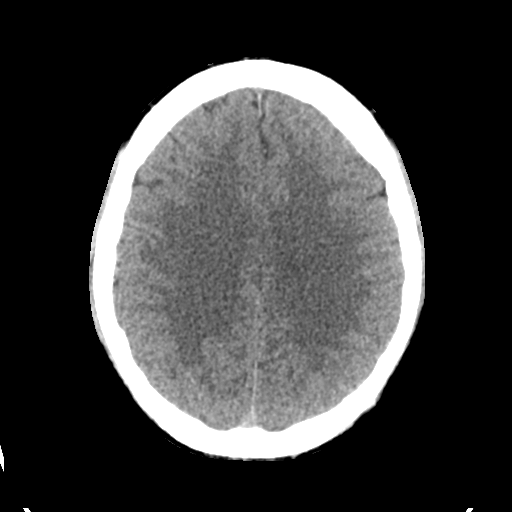
[im 21/34  bone]
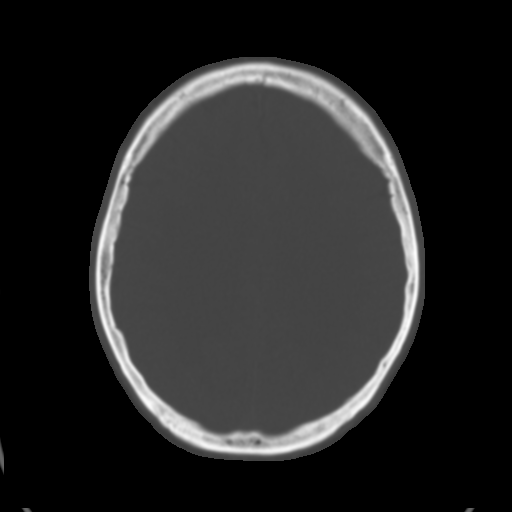
[im 25/34  brain]
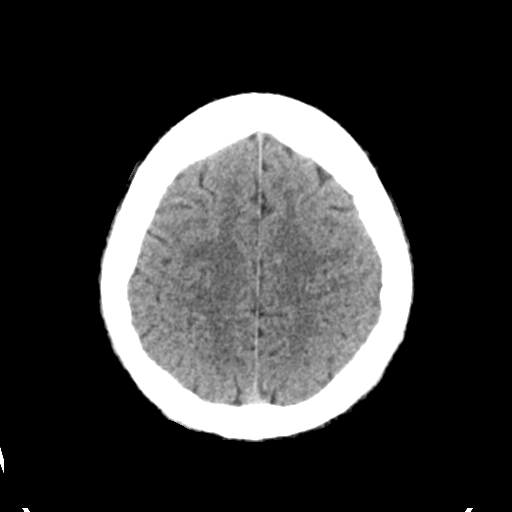
[im 29/34  brain]
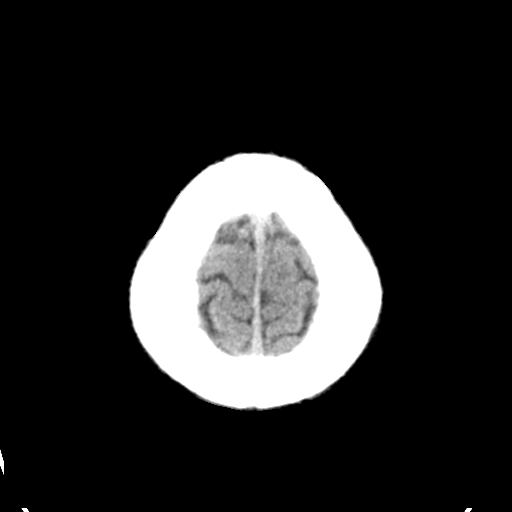

[Series 4: head bone · axial · 0.44mm/px · z∈[-150,-116]mm · 3 of 85 slices shown]
[im 9/85  bone]
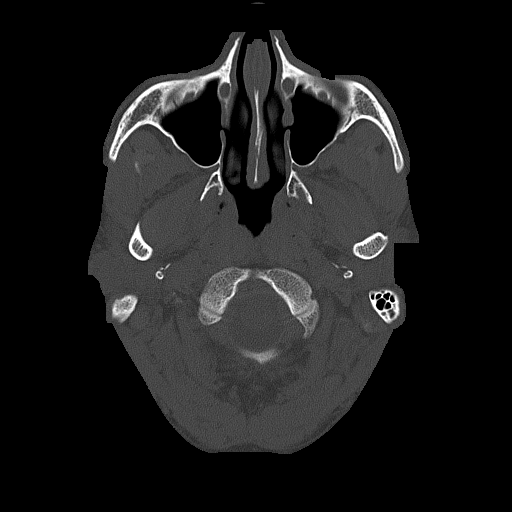
[im 17/85  bone]
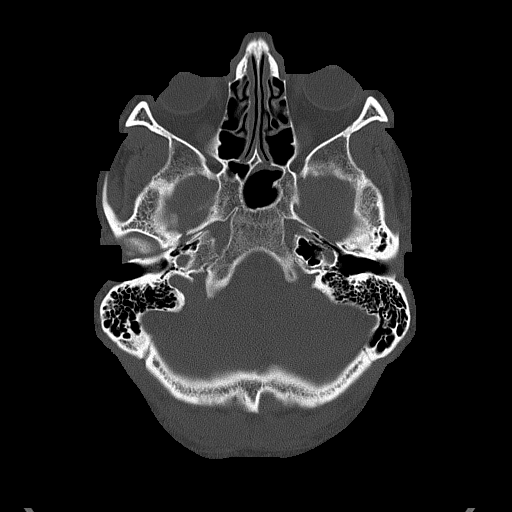
[im 26/85  bone]
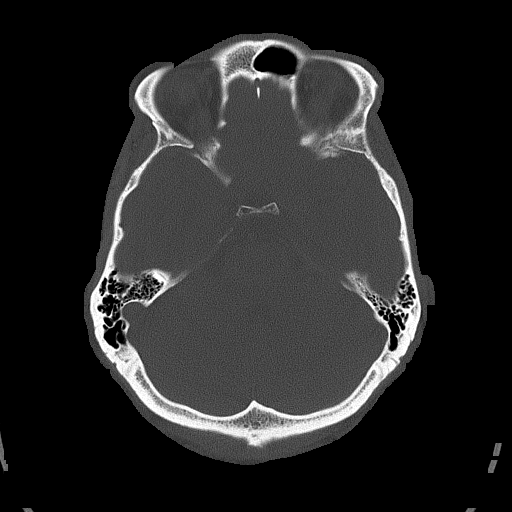

[Series 5: head without cor · coronal · non-contrast · 0.33mm/px · 3 of 75 slices shown]
[im 25/75  brain]
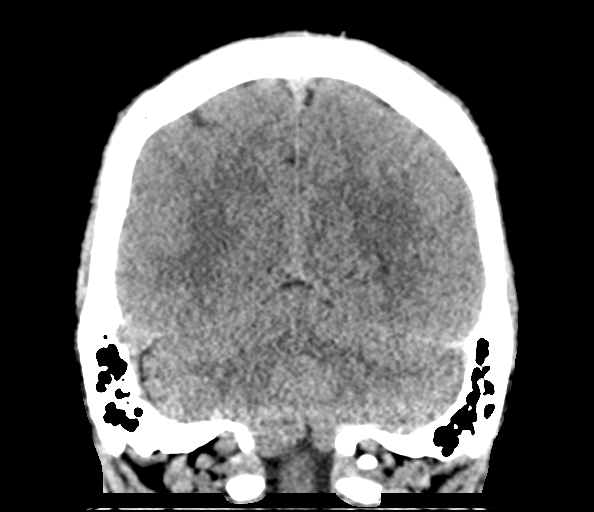
[im 33/75  brain]
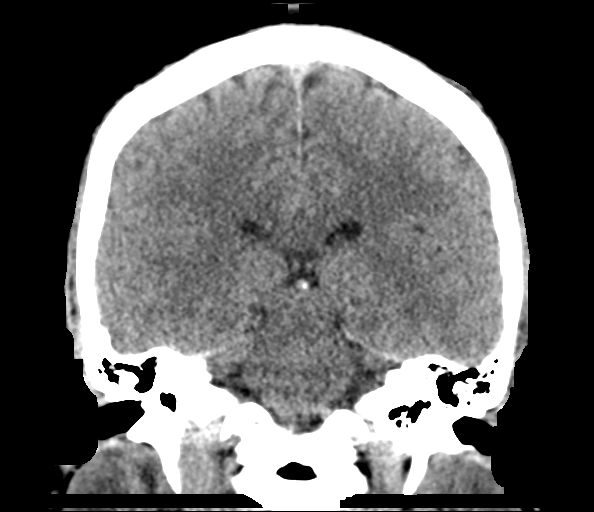
[im 42/75  brain]
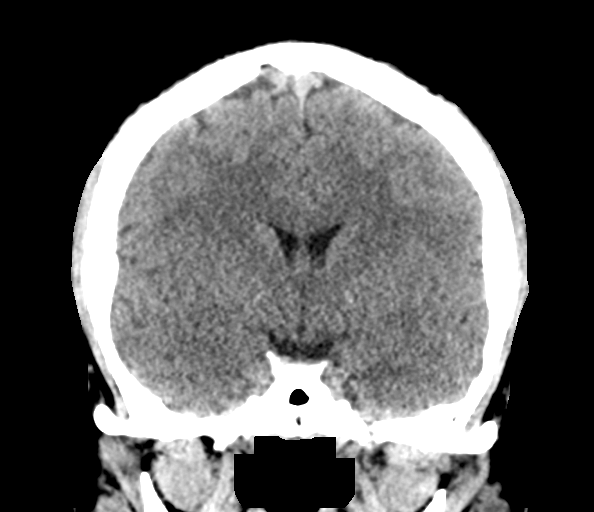

[Series 6: head without sag · sagittal · non-contrast · 0.34mm/px · 3 of 61 slices shown]
[im 21/61  brain]
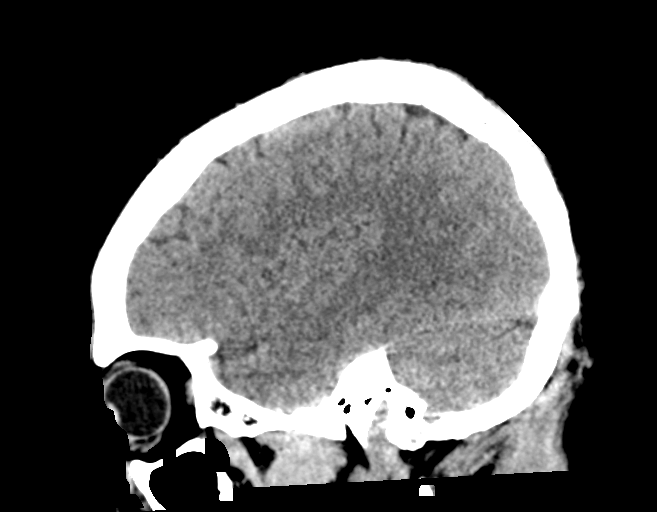
[im 31/61  brain]
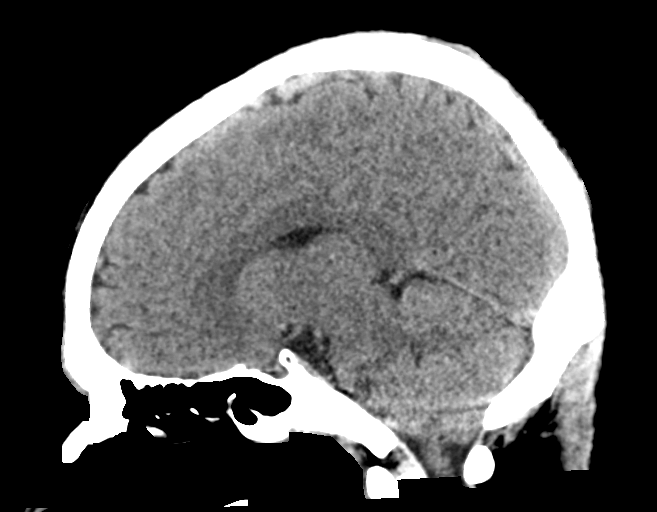
[im 41/61  brain]
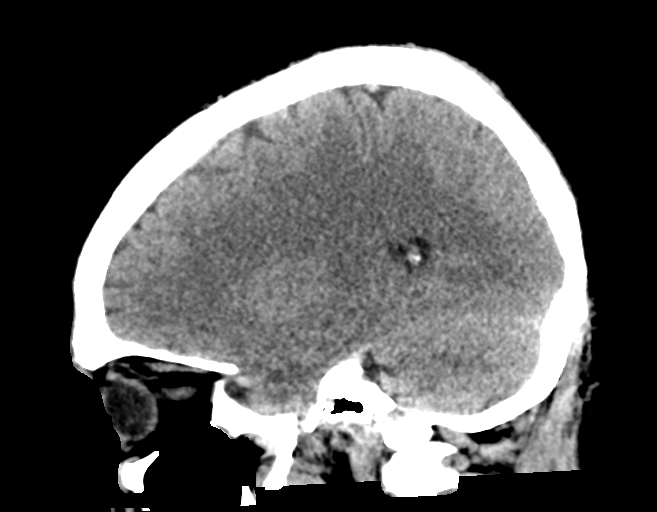

[16 of 47 positions shown; findings below may reference images not displayed]

FINDINGS: Brain: No evidence of acute infarction, hemorrhage, hydrocephalus,
extra-axial collection or mass lesion/mass effect.

Vascular: No hyperdense vessel or unexpected calcification.

Skull: No osseous abnormality.

Sinuses/Orbits: Mild bilateral maxillary sinus mucosal thickening.
Visualized mastoid sinuses are clear. Visualized orbits demonstrate
no focal abnormality.

Other: None
IMPRESSION: No acute intracranial pathology.

## 2024-02-07 ENCOUNTER — Emergency Department (HOSPITAL_COMMUNITY): Payer: Self-pay

## 2024-02-07 ENCOUNTER — Other Ambulatory Visit: Payer: Self-pay

## 2024-02-07 ENCOUNTER — Encounter (HOSPITAL_COMMUNITY): Payer: Self-pay | Admitting: Emergency Medicine

## 2024-02-07 ENCOUNTER — Emergency Department (HOSPITAL_COMMUNITY)
Admission: EM | Admit: 2024-02-07 | Discharge: 2024-02-07 | Discharge status: Against Medical Advice | Payer: Self-pay | Attending: Emergency Medicine | Admitting: Emergency Medicine

## 2024-02-07 DIAGNOSIS — R112 Nausea with vomiting, unspecified: Secondary | ICD-10-CM | POA: Insufficient documentation

## 2024-02-07 DIAGNOSIS — E860 Dehydration: Secondary | ICD-10-CM | POA: Insufficient documentation

## 2024-02-07 DIAGNOSIS — R1013 Epigastric pain: Secondary | ICD-10-CM | POA: Insufficient documentation

## 2024-02-07 DIAGNOSIS — R1010 Upper abdominal pain, unspecified: Secondary | ICD-10-CM | POA: Insufficient documentation

## 2024-02-07 DIAGNOSIS — R030 Elevated blood-pressure reading, without diagnosis of hypertension: Secondary | ICD-10-CM

## 2024-02-07 DIAGNOSIS — Z5329 Procedure and treatment not carried out because of patient's decision for other reasons: Secondary | ICD-10-CM | POA: Insufficient documentation

## 2024-02-07 LAB — COMPREHENSIVE METABOLIC PANEL
ALT: 39 U/L (ref 0–44)
AST: 34 U/L (ref 15–41)
Albumin: 3.9 g/dL (ref 3.5–5.0)
Alkaline Phosphatase: 69 U/L (ref 38–126)
Anion gap: 6 (ref 5–15)
BUN: 11 mg/dL (ref 6–20)
CO2: 26 mmol/L (ref 22–32)
Calcium: 9.3 mg/dL (ref 8.9–10.3)
Chloride: 103 mmol/L (ref 98–111)
Creatinine, Ser: 1.27 mg/dL — ABNORMAL HIGH (ref 0.61–1.24)
GFR, Estimated: >60 mL/min (ref >60)
Glucose, Bld: 117 mg/dL — ABNORMAL HIGH (ref 70–99)
Potassium: 3.9 mmol/L (ref 3.5–5.1)
Sodium: 135 mmol/L (ref 135–145)
Total Bilirubin: 1.1 mg/dL (ref 0.0–1.2)
Total Protein: 7.7 g/dL (ref 6.5–8.1)

## 2024-02-07 LAB — CBC WITH DIFFERENTIAL/PLATELET
Abs Immature Granulocytes: 0.01 10*3/uL (ref 0.00–0.07)
Basophils Absolute: 0.1 10*3/uL (ref 0.0–0.1)
Basophils Relative: 1 %
Eosinophils Absolute: 0.1 10*3/uL (ref 0.0–0.5)
Eosinophils Relative: 1 %
HCT: 52.9 % — ABNORMAL HIGH (ref 39.0–52.0)
Hemoglobin: 17.5 g/dL — ABNORMAL HIGH (ref 13.0–17.0)
Immature Granulocytes: 0 %
Lymphocytes Relative: 23 %
Lymphs Abs: 1.8 10*3/uL (ref 0.7–4.0)
MCH: 27.2 pg (ref 26.0–34.0)
MCHC: 33.1 g/dL (ref 30.0–36.0)
MCV: 82.1 fL (ref 80.0–100.0)
Monocytes Absolute: 0.5 10*3/uL (ref 0.1–1.0)
Monocytes Relative: 6 %
Neutro Abs: 5.5 10*3/uL (ref 1.7–7.7)
Neutrophils Relative %: 69 %
Platelets: 370 10*3/uL (ref 150–400)
RBC: 6.44 MIL/uL — ABNORMAL HIGH (ref 4.22–5.81)
RDW: 13.3 % (ref 11.5–15.5)
WBC: 7.9 10*3/uL (ref 4.0–10.5)
nRBC: 0 % (ref 0.0–0.2)

## 2024-02-07 LAB — LIPASE, BLOOD: Lipase: 35 U/L (ref 11–51)

## 2024-02-07 MED ORDER — IOHEXOL 350 MG/ML SOLN
75.0000 mL | Freq: Once | INTRAVENOUS | Status: AC | PRN
  Administered 2024-02-07: 75 mL via INTRAVENOUS

## 2024-02-07 MED ORDER — ONDANSETRON HCL 4 MG/2ML IJ SOLN
4.0000 mg | Freq: Once | INTRAMUSCULAR | Status: AC
  Administered 2024-02-07: 4 mg via INTRAVENOUS
  Filled 2024-02-07: qty 2

## 2024-02-07 MED ORDER — PANTOPRAZOLE SODIUM 40 MG IV SOLR
40.0000 mg | Freq: Once | INTRAVENOUS | Status: AC
  Administered 2024-02-07: 40 mg via INTRAVENOUS
  Filled 2024-02-07: qty 10

## 2024-02-07 MED ORDER — MORPHINE SULFATE (PF) 4 MG/ML IV SOLN
4.0000 mg | Freq: Once | INTRAVENOUS | Status: AC
  Administered 2024-02-07: 4 mg via INTRAVENOUS
  Filled 2024-02-07: qty 1

## 2024-02-07 MED ORDER — ALUM & MAG HYDROXIDE-SIMETH 200-200-20 MG/5ML PO SUSP
30.0000 mL | Freq: Once | ORAL | Status: AC
  Administered 2024-02-07: 30 mL via ORAL
  Filled 2024-02-07: qty 30

## 2024-02-07 MED ORDER — FAMOTIDINE 20 MG PO TABS
20.0000 mg | ORAL_TABLET | Freq: Once | ORAL | Status: AC
  Administered 2024-02-07: 20 mg via ORAL
  Filled 2024-02-07: qty 1

## 2024-02-07 MED ORDER — SODIUM CHLORIDE 0.9 % IV BOLUS
1000.0000 mL | Freq: Once | INTRAVENOUS | Status: AC
  Administered 2024-02-07: 1000 mL via INTRAVENOUS

## 2024-02-07 NOTE — ED Notes (Signed)
 Pt up for discharge but not found in room. Monitoring found pulled off and left in bed. Pt IV was not removed and no IV catheter found in room. Security notified. [facility] called and notified we are looking for him to remove his IV and discharge pt.

## 2024-02-07 NOTE — ED Provider Triage Note (Signed)
 Emergency Medicine Provider Triage Evaluation Note  Ricky Callahan , a 48 y.o. male  was evaluated in triage.  Pt complains of abdominal pain for several days.  Review of Systems  Positive: Abdominal pain, vomiting  Negative: fever  Physical Exam  BP (!) 121/106 (BP Location: Right Arm)   Pulse (!) 126   Temp 97.6 F (36.4 C)   Resp 17   Ht 5\' 6"  (1.676 m)   Wt 88.5 kg   SpO2 100%   BMI 31.47 kg/m  Gen:   Awake, no distress  appears uncomfortable Resp:  Normal effort  MSK:   Moves extremities without difficulty  Other:  Moderate upper abdominal tenderness  Medical Decision Making  Medically screening exam initiated at 6:47 AM.  Appropriate orders placed.  Dhilan Phillips was informed that the remainder of the evaluation will be completed by another provider, this initial triage assessment does not replace that evaluation, and the importance of remaining in the ED until their evaluation is complete.     Tilden Fossa, MD 02/07/24 [phone]

## 2024-02-07 NOTE — ED Notes (Signed)
 Per security, video footage of pt shows no IV in either arm.

## 2024-02-07 NOTE — Discharge Instructions (Addendum)
 It was our pleasure to provide your ER care today - we hope that you feel better.  Drink plenty of fluids/stay well hydrated. Take zofran as need for nausea.   Follow up closely with primary care doctor in 1-2 days if symptoms fail to improve/resolve. Also follow up with primary care doctor for recheck of blood pressure that is high today.   Return to ER if worse, new symptoms, high fevers, new or worsening or severe abdominal pain, persistent vomiting, chest pain, trouble breathing, leg pain or swelling, or other concern.  You were given pain medication in the ER - no driving for the next 6 hours.

## 2024-02-07 NOTE — ED Provider Notes (Signed)
 [employer] EMERGENCY DEPARTMENT AT [facility] Provider Note   CSN: [id number] Arrival date & time: 02/07/24  4098     History  Chief Complaint  Patient presents with   Abdominal Pain    Ricky Callahan is a 48 y.o. male.  Pt c/o epigastric pain, occasionally radiates to back, dull, constant, moderate. No hx same pain. +nausea, episodes of emesis, not bloody. No abd distension or constipation. Also w occasional heartburn sensation, at rest. No hx pud, pancreatitis or gallstones. No fever or chills. No chest pain or sob. No cough or uri symptoms. No fever or chills.   The history is provided by the patient and medical records.  Abdominal Pain Associated symptoms: nausea and vomiting   Associated symptoms: no chest pain, no cough, no dysuria, no fever, no shortness of breath and no sore throat        Home Medications Prior to Admission medications   Not on File      Allergies    Patient has no known allergies.    Review of Systems   Review of Systems  Constitutional:  Negative for fever.  HENT:  Negative for sore throat.   Eyes:  Negative for redness.  Respiratory:  Negative for cough and shortness of breath.   Cardiovascular:  Negative for chest pain.  Gastrointestinal:  Positive for abdominal pain, nausea and vomiting.  Genitourinary:  Negative for dysuria and flank pain.  Musculoskeletal:  Negative for neck pain.  Neurological:  Negative for headaches.    Physical Exam Updated Vital Signs BP (!) 131/97   Pulse 95   Temp 97.6 F (36.4 C)   Resp 15   Ht 1.676 m (5\' 6" )   Wt 88.5 kg   SpO2 98%   BMI 31.47 kg/m  Physical Exam Vitals and nursing note reviewed.  Constitutional:      Appearance: Normal appearance. He is well-developed.  HENT:     Head: Atraumatic.     Nose: Nose normal.     Mouth/Throat:     Mouth: Mucous membranes are moist.     Pharynx: Oropharynx is clear.  Eyes:     General: No scleral icterus.    Conjunctiva/sclera:  Conjunctivae normal.  Neck:     Trachea: No tracheal deviation.  Cardiovascular:     Rate and Rhythm: Normal rate and regular rhythm.     Pulses: Normal pulses.     Heart sounds: Normal heart sounds. No murmur heard.    No friction rub. No gallop.  Pulmonary:     Effort: Pulmonary effort is normal. No accessory muscle usage or respiratory distress.     Breath sounds: Normal breath sounds.  Abdominal:     General: Bowel sounds are normal. There is no distension.     Palpations: Abdomen is soft.     Tenderness: There is abdominal tenderness. There is no guarding.     Comments: Epigastric tenderness.   Genitourinary:    Comments: No cva tenderness. Musculoskeletal:        General: No swelling or tenderness.     Cervical back: Normal range of motion and neck supple. No rigidity.     Right lower leg: No edema.     Left lower leg: No edema.  Skin:    General: Skin is warm and dry.     Findings: No rash.  Neurological:     Mental Status: He is alert.     Comments: Alert, speech clear.   Psychiatric:  Mood and Affect: Mood normal.     ED Results / Procedures / Treatments   Labs (all labs ordered are listed, but only abnormal results are displayed) Results for orders placed or performed during the hospital encounter of 02/07/24  Comprehensive metabolic panel   Collection Time: 02/07/24  6:47 AM  Result Value Ref Range   Sodium 135 135 - 145 mmol/L   Potassium 3.9 3.5 - 5.1 mmol/L   Chloride 103 98 - 111 mmol/L   CO2 26 22 - 32 mmol/L   Glucose, Bld 117 (H) 70 - 99 mg/dL   BUN 11 6 - 20 mg/dL   Creatinine, Ser 6.04 (H) 0.61 - 1.24 mg/dL   Calcium 9.3 8.9 - 54.0 mg/dL   Total Protein 7.7 6.5 - 8.1 g/dL   Albumin 3.9 3.5 - 5.0 g/dL   AST 34 15 - 41 U/L   ALT 39 0 - 44 U/L   Alkaline Phosphatase 69 38 - 126 U/L   Total Bilirubin 1.1 0.0 - 1.2 mg/dL   GFR, Estimated >98 >11 mL/min   Anion gap 6 5 - 15  Lipase, blood   Collection Time: 02/07/24  6:47 AM  Result Value  Ref Range   Lipase 35 11 - 51 U/L  CBC with Differential   Collection Time: 02/07/24  6:47 AM  Result Value Ref Range   WBC 7.9 4.0 - 10.5 K/uL   RBC 6.44 (H) 4.22 - 5.81 MIL/uL   Hemoglobin 17.5 (H) 13.0 - 17.0 g/dL   HCT 91.4 (H) 78.2 - 95.6 %   MCV 82.1 80.0 - 100.0 fL   MCH 27.2 26.0 - 34.0 pg   MCHC 33.1 30.0 - 36.0 g/dL   RDW 21.3 08.6 - 57.8 %   Platelets 370 150 - 400 K/uL   nRBC 0.0 0.0 - 0.2 %   Neutrophils Relative % 69 %   Neutro Abs 5.5 1.7 - 7.7 K/uL   Lymphocytes Relative 23 %   Lymphs Abs 1.8 0.7 - 4.0 K/uL   Monocytes Relative 6 %   Monocytes Absolute 0.5 0.1 - 1.0 K/uL   Eosinophils Relative 1 %   Eosinophils Absolute 0.1 0.0 - 0.5 K/uL   Basophils Relative 1 %   Basophils Absolute 0.1 0.0 - 0.1 K/uL   Immature Granulocytes 0 %   Abs Immature Granulocytes 0.01 0.00 - 0.07 K/uL   CT ABDOMEN PELVIS W CONTRAST Result Date: 02/07/2024 CLINICAL DATA:  Two day history of epigastric pain with nausea and vomiting EXAM: CT ABDOMEN AND PELVIS WITH CONTRAST TECHNIQUE: Multidetector CT imaging of the abdomen and pelvis was performed using the standard protocol following bolus administration of intravenous contrast. RADIATION DOSE REDUCTION: This exam was performed according to the departmental dose-optimization program which includes automated exposure control, adjustment of the mA and/or kV according to patient size and/or use of iterative reconstruction technique. CONTRAST:  75mL OMNIPAQUE IOHEXOL 350 MG/ML SOLN COMPARISON:  None Available. FINDINGS: Lower chest: No focal consolidation or pulmonary nodule in the lung bases. No pleural effusion or pneumothorax demonstrated. Partially imaged heart size is normal. Hepatobiliary: No focal hepatic lesions. No intra or extrahepatic biliary ductal dilation. Normal gallbladder. Pancreas: No focal lesions or main ductal dilation. Spleen: Normal in size without focal abnormality. Adrenals/Urinary Tract: No adrenal nodules. No suspicious  renal mass, calculi or hydronephrosis. No focal bladder wall thickening. Stomach/Bowel: Small hiatal hernia. Subcentimeter rounded radiodensities within the dependent gastric fundus (3:18), most likely ingested material. No abnormal mural thickening. A  few loops of mildly dilated small bowel within the left upper quadrant. Normal appendix. Vascular/Lymphatic: No significant vascular findings are present. Subtle asymmetric hypoattenuation of the right common iliac vein (3:63), likely related to mixing artifact. No enlarged abdominal or pelvic lymph nodes. Reproductive: Prostate is unremarkable. Other: No free fluid, fluid collection, or free air. Musculoskeletal: No acute or abnormal lytic or blastic osseous lesions. Small fat-containing left inguinal hernia. IMPRESSION: 1. A few loops of mildly dilated small bowel within the left upper quadrant, which may be related to ileus or enteritis. 2. Small hiatal hernia. 3. Subtle asymmetric hypoattenuation of the right common iliac vein, likely related to mixing artifact. If there is clinical concern for deep venous thrombosis, further evaluation with dedicated lower extremity venous ultrasound examination is recommended. Electronically Signed   By: Agustin Cree M.D.   On: 02/07/2024 10:11   DG Chest Port 1 View Result Date: 02/07/2024 CLINICAL DATA:  Chest pain EXAM: PORTABLE CHEST 1 VIEW COMPARISON:  Chest radiograph dated 01/08/2018 FINDINGS: Normal lung volumes. No focal consolidations. No pleural effusion or pneumothorax. The heart size and mediastinal contours are within normal limits. No acute osseous abnormality. IMPRESSION: No acute disease. Electronically Signed   By: Agustin Cree M.D.   On: 02/07/2024 10:00    EKG EKG Interpretation Date/Time:  Friday February 07 2024 06:46:34 EST Ventricular Rate:  109 PR Interval:  150 QRS Duration:  80 QT Interval:  296 QTC Calculation: 399 R Axis:   70  Text Interpretation: Sinus tachycardia No previous tracing  Confirmed by Cathren Laine ([id number]) on 02/07/2024 8:13:45 AM  Radiology CT ABDOMEN PELVIS W CONTRAST Result Date: 02/07/2024 CLINICAL DATA:  Two day history of epigastric pain with nausea and vomiting EXAM: CT ABDOMEN AND PELVIS WITH CONTRAST TECHNIQUE: Multidetector CT imaging of the abdomen and pelvis was performed using the standard protocol following bolus administration of intravenous contrast. RADIATION DOSE REDUCTION: This exam was performed according to the departmental dose-optimization program which includes automated exposure control, adjustment of the mA and/or kV according to patient size and/or use of iterative reconstruction technique. CONTRAST:  75mL OMNIPAQUE IOHEXOL 350 MG/ML SOLN COMPARISON:  None Available. FINDINGS: Lower chest: No focal consolidation or pulmonary nodule in the lung bases. No pleural effusion or pneumothorax demonstrated. Partially imaged heart size is normal. Hepatobiliary: No focal hepatic lesions. No intra or extrahepatic biliary ductal dilation. Normal gallbladder. Pancreas: No focal lesions or main ductal dilation. Spleen: Normal in size without focal abnormality. Adrenals/Urinary Tract: No adrenal nodules. No suspicious renal mass, calculi or hydronephrosis. No focal bladder wall thickening. Stomach/Bowel: Small hiatal hernia. Subcentimeter rounded radiodensities within the dependent gastric fundus (3:18), most likely ingested material. No abnormal mural thickening. A few loops of mildly dilated small bowel within the left upper quadrant. Normal appendix. Vascular/Lymphatic: No significant vascular findings are present. Subtle asymmetric hypoattenuation of the right common iliac vein (3:63), likely related to mixing artifact. No enlarged abdominal or pelvic lymph nodes. Reproductive: Prostate is unremarkable. Other: No free fluid, fluid collection, or free air. Musculoskeletal: No acute or abnormal lytic or blastic osseous lesions. Small fat-containing left inguinal  hernia. IMPRESSION: 1. A few loops of mildly dilated small bowel within the left upper quadrant, which may be related to ileus or enteritis. 2. Small hiatal hernia. 3. Subtle asymmetric hypoattenuation of the right common iliac vein, likely related to mixing artifact. If there is clinical concern for deep venous thrombosis, further evaluation with dedicated lower extremity venous ultrasound examination is recommended. Electronically Signed  By: Agustin Cree M.D.   On: 02/07/2024 10:11   DG Chest Port 1 View Result Date: 02/07/2024 CLINICAL DATA:  Chest pain EXAM: PORTABLE CHEST 1 VIEW COMPARISON:  Chest radiograph dated 01/08/2018 FINDINGS: Normal lung volumes. No focal consolidations. No pleural effusion or pneumothorax. The heart size and mediastinal contours are within normal limits. No acute osseous abnormality. IMPRESSION: No acute disease. Electronically Signed   By: Agustin Cree M.D.   On: 02/07/2024 10:00    Procedures Procedures    Medications Ordered in ED Medications  sodium chloride 0.9 % bolus 1,000 mL (1,000 mLs Intravenous New Bag/Given 02/07/24 0655)  morphine (PF) 4 MG/ML injection 4 mg (4 mg Intravenous Given 02/07/24 0655)  ondansetron (ZOFRAN) injection 4 mg (4 mg Intravenous Given 02/07/24 0655)  pantoprazole (PROTONIX) injection 40 mg (40 mg Intravenous Given 02/07/24 0817)  alum & mag hydroxide-simeth (MAALOX/MYLANTA) 200-200-20 MG/5ML suspension 30 mL (30 mLs Oral Given 02/07/24 0821)  famotidine (PEPCID) tablet 20 mg (20 mg Oral Given 02/07/24 0821)  iohexol (OMNIPAQUE) 350 MG/ML injection 75 mL (75 mLs Intravenous Contrast Given 02/07/24 1610)    ED Course/ Medical Decision Making/ A&P                                 Medical Decision Making Problems Addressed: Dehydration: acute illness or injury with systemic symptoms that poses a threat to life or bodily functions Nausea and vomiting in adult: acute illness or injury with systemic symptoms Upper abdominal pain: acute illness or  injury with systemic symptoms that poses a threat to life or bodily functions  Amount and/or Complexity of Data Reviewed Independent Historian: friend    Details: hx External Data Reviewed: notes. Labs: ordered. Decision-making details documented in ED Course. Radiology: ordered and independent interpretation performed. Decision-making details documented in ED Course. ECG/medicine tests: ordered and independent interpretation performed. Decision-making details documented in ED Course.  Risk OTC drugs. Prescription drug management. Parenteral controlled substances. Decision regarding hospitalization.   Iv ns. Continuous pulse ox and cardiac monitoring. Labs ordered/sent. Imaging ordered.   Differential diagnosis includes pancreatitis, pud, gallstones, gastritis, etc. Dispo decision including potential need for admission considered - will get labs and imaging and reassess.   Reviewed nursing notes and prior charts for additional history. External reports reviewed. Additional history from: friend.   Cardiac monitor: sinus rhythm, rate 100.  IVF bolus. Morphine iv. Zofran iv. Protonix iv.   Labs reviewed/interpreted by me - wbc normal. Hgb 17. Lipase normal.   CT reviewed/interpreted by me - ?enteritis.   Recheck abd soft non tender. Tolerating po.  No leg pain or swelling.    Pt currently appears stable for d/c.   Rec close pcp f/u.  Return precautions provided.          Final Clinical Impression(s) / ED Diagnoses Final diagnoses:  Upper abdominal pain  Nausea and vomiting in adult  Dehydration    Rx / DC Orders ED Discharge Orders     None         Cathren Laine, MD 02/07/24 1024

## 2024-02-07 NOTE — ED Triage Notes (Addendum)
 Patient complaining of epigastric pain and chest pain x2 days. Reports nausea + vomiting. States that the pain feels like heartburn.

## 2024-08-21 ENCOUNTER — Ambulatory Visit (HOSPITAL_COMMUNITY): Admission: EM | Admit: 2024-08-21 | Discharge: 2024-08-21 | Disposition: A | Discharge status: Home / Self Care

## 2024-08-25 ENCOUNTER — Ambulatory Visit (HOSPITAL_COMMUNITY)
Admission: EM | Admit: 2024-08-25 | Discharge: 2024-08-25 | Disposition: A | Discharge status: Home / Self Care | Attending: Psychiatry | Admitting: Psychiatry

## 2024-08-25 ENCOUNTER — Ambulatory Visit (HOSPITAL_COMMUNITY): Admission: EM | Admit: 2024-08-25 | Discharge: 2024-08-25 | Discharge status: Absent without leave | Payer: Self-pay

## 2024-08-25 DIAGNOSIS — Z7689 Persons encountering health services in other specified circumstances: Secondary | ICD-10-CM

## 2024-08-25 DIAGNOSIS — Z008 Encounter for other general examination: Secondary | ICD-10-CM | POA: Insufficient documentation

## 2024-08-25 NOTE — Discharge Instructions (Addendum)
 We do not offer mental health evaluation services here. However, here are some resources that may be able to provide the services you are looking for: [email or web address] Schedule an Appointment To schedule an appointment or to become a new patient, call us  today.  ? [phone] ? [phone] ? Office [address] [place], [place] [address]  ? Hours Monday - Friday: 8:00am - 5:00pm Saturday: Closed Sunday: Closed [email or web address] [address] [place], [place] [address] [phone] Fax documents: [phone]  [email or web address] [address] [place], [place]  [address] Phone: [phone]  Fax: [phone]  Get help right away if: You have thoughts about hurting yourself or others. Get help right away if you feel like you may hurt yourself or others, or have thoughts about taking your own life. Go to your nearest emergency room or: Call 911. Call the National Suicide Prevention Lifeline at 270 769 6896 or 988 in the [place] This is open 24 hours a day. If you're a Veteran: Call 988 and press 1. This is open 24 hours a day. Text the [employer] at 405-308-3502. Summary Mental health is not just the absence of mental illness. It involves understanding your emotions and behaviors, and taking steps to manage them in a healthy way. If you have symptoms of mental or emotional distress, get help from family, friends, a health care provider, or a mental health professional. Practice good mental health behaviors such as stress management skills, self-calming skills, exercise, healthy sleeping and eating, and supportive relationships. This information is not intended to replace advice given to you by your health care provider. Make sure you discuss any  questions you have with your health care provider including your blood pressure which is a bit elevated today.   Education provided on the fact that if experiencing worsening of psychiatry symptoms including suicidal ideations, homicidal ideations, or having auditory/visual hallucinations, etc, to call 911, 988, come back to this location, or go to the nearest ER.  Pt verbalized understanding.

## 2024-08-25 NOTE — Progress Notes (Signed)
   08/25/24 0710  [facility] Triage Screening (Walk-ins at [facility] only)  How Did You Hear About Us ? Family/Friend  What Is the Reason for Your Visit/Call Today? Patient present to [facility] voluntarily and accompanied by his wife, Reena Lipoma.  Pt referred here from DSS for evaluation for SAIOP.  Pt denies SI, HI, AVH/Drug/Alcohol.  Pt states I don't needed the  services, I am here with my wife.  Pt denies any prior MH diagnosis or prerscribed medication for symptom management.  How Long Has This Been Causing You Problems? <Week  Have You Recently Had Any Thoughts About Hurting Yourself? No  Are You Planning to Commit Suicide/Harm Yourself At This time? No  Have you Recently Had Thoughts About Hurting Someone [place]? No  Are You Planning To Harm Someone At This Time? No  Physical Abuse Denies  Verbal Abuse Denies  Sexual Abuse Denies  Exploitation of patient/patient's resources Denies  Self-Neglect Denies  Possible abuse reported to: Other (Comment)  Are you currently experiencing any auditory, visual or other hallucinations? No  Have You Used Any Alcohol or Drugs in the Past 24 Hours? No  Do you have any current medical co-morbidities that require immediate attention? No  Clinician description of patient physical appearance/behavior: engaged  What Do You Feel Would Help You the Most Today? Alcohol or Drug Use Treatment  If access to [facility] was not available, would you have sought care in the Emergency Department? Yes  Determination of Need Routine (7 days)  Options For Referral Outpatient Therapy

## 2024-08-25 NOTE — ED Provider Notes (Signed)
 Behavioral [facility] Urgent [facility] Medical Screening Exam  Patient Name: Ricky Callahan MRN: [id number] Date of Evaluation: 08/25/24 Chief Complaint: DSS advised obtaining a mental health evaluation Diagnosis:  Final diagnoses:  Encounter for psychiatric assessment    History of Present illness: Ricky Callahan 48 y.o., male patient presented to [facility] as a voluntary walk in accompanied by his wife, who reports that DSS advised obtaining a mental health evaluation.  Patient states that DSS currently has custody of their 73-month-old baby, who tested positive for methamphetamines at birth. The patient and his wife are working to regain custody, and they have a court date scheduled for tomorrow. One of the requirements for reunification is that both parents obtain a mental health evaluation.  He denies any medical or mental health history. He reports that he does not have a psychiatrist/therapist and denies suicidal ideation, homicidal ideation, auditory or visual hallucinations, or paranoia. He also denies any history of abuse or trauma. Patient denies episodes of crying, hopelessness, worthlessness, decreased appetite, or difficulty concentrating. He expresses confusion about the requirement, stating, "I don't even know why I have to go through this."  He denies taking any medications.    Ricky Callahan, is seen face to face by this provider, consulted with Dr. Cole; and chart reviewed on 08/25/24.  On evaluation, Ricky Callahan reports that he is seeking a mental health evaluation in order to regain custody of his infant son. He reports having three grown children, and his wife has four children as well. He states that the current events are primarily related to his wife and have nothing to do with him. 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 He reports having completed some college courses. He denies any history of substance use. He reports that his sleep  and appetite are good.  During evaluation Ricky Callahan is sitting upright in no acute distress. He is alert & oriented x 4, calm, cooperative and attentive for this assessment. His mood is euthymic with congruent affect.  He has normal speech, and behavior.  Objectively there is no evidence of psychosis/mania or delusional thinking. Pt does not appear to be responding to internal or external stimuli.  Patient is able to converse coherently, goal directed thoughts, no distractibility, or pre-occupation. He also denies suicidal/self-harm/homicidal ideation, psychosis, and paranoia.  Patient answered question appropriately.    Flowsheet Row ED from 02/07/2024 in [facility] Emergency Department at [facility]  C-SSRS RISK CATEGORY No Risk    Psychiatric Specialty Exam  Presentation  General Appearance:Appropriate for Environment  Eye Contact:Good  Speech:Clear and Coherent; Normal Rate  Speech Volume:Normal  Handedness:Right   Mood and Affect  Mood: Euthymic  Affect: Congruent   Thought Process  Thought Processes: Coherent; Goal Directed; Linear  Descriptions of Associations:Intact  Orientation:Full (Time, Place and Person)  Thought Content:Logical    Hallucinations:None  Ideas of Reference:None  Suicidal Thoughts:No  Homicidal Thoughts:No   Sensorium  Memory: Immediate Good; Recent Good  Judgment: Good  Insight: Good   Executive Functions  Concentration: Good  Attention Span: Good  Recall: Good  Fund of Knowledge: Fair  Language: Fair   Psychomotor Activity  Psychomotor Activity: Normal   Assets  Assets: Communication Skills; Vocational/Educational   Sleep  Sleep: Good  Number of hours: No data recorded  Physical Exam: Physical Exam Vitals reviewed.  Constitutional:      Appearance: Normal appearance.  HENT:     Head: Normocephalic and atraumatic.  Nose: Nose normal.     Mouth/Throat:     Pharynx: Oropharynx  is clear.  Cardiovascular:     Rate and Rhythm: Normal rate.  Pulmonary:     Effort: Pulmonary effort is normal.  Musculoskeletal:        General: Normal range of motion.     Cervical back: Normal range of motion.  Skin:    General: Skin is warm.  Neurological:     Mental Status: He is alert and oriented to person, place, and time.  Psychiatric:        Mood and Affect: Mood normal.        Behavior: Behavior normal.        Thought Content: Thought content normal.        Judgment: Judgment normal.    Review of Systems  All other systems reviewed and are negative.  Blood pressure (!) 148/107, pulse 72, temperature 97.8 F (36.6 C), temperature source Oral, resp. rate 17, SpO2 100%. There is no height or weight on file to calculate BMI.  Musculoskeletal: Strength & Muscle Tone: within normal limits Gait & Station: normal Patient leans: N/A   [facility] MSE Discharge Disposition for Follow up and Recommendations: Based on my evaluation the patient does not appear to have an emergency medical condition and can be discharged with resources and follow up care in outpatient services for mental health evaluation services.   We do not offer mental health evaluation services here.  However, here are some resources that may be able to provide the services you are looking for.  [email or web address] Schedule an Appointment To schedule an appointment or to become a new patient, call us  today.  ? [phone] ? [phone] ? Office [address] [place], [place] [address]  ? Hours Monday - Friday: 8:00am - 5:00pm Saturday: Closed Sunday: Closed [email or web address] [address] [place], [place] [address] [phone] [email or web address] documents: [phone]  [email or web address] [address] [place], [place]  [address] Phone: [phone]  Fax: [phone]  Get help right away if: You have thoughts about hurting yourself or others. Get help right away if you feel like you may hurt yourself or others, or have thoughts about taking your own life. Go to your nearest emergency room or: Call 911. Call the National Suicide Prevention Lifeline at 330 037 9323 or 988 in the [place] This is open 24 hours a day. If you're a Veteran: Call 988 and press 1. This is open 24 hours a day. Text the [employer] at 3100924020. Summary Mental health is not just the absence of mental illness. It involves understanding your emotions and behaviors, and taking steps to manage them in a healthy way. If you have symptoms of mental or emotional distress, get help from family, friends, a health care provider, or a mental health professional. Practice good mental health behaviors such as stress management skills, self-calming skills, exercise, healthy sleeping and eating, and supportive relationships. This information is not intended to replace advice given to you by your health care provider. Make sure you discuss any questions you have with your health care provider including your blood pressure which is a bit elevated today.   Education provided on the fact that if experiencing worsening of psychiatry symptoms including suicidal ideations, homicidal ideations, or having auditory/visual hallucinations, etc, to call 911, 988, come back to this location, or go to the nearest ER.  Pt verbalized understanding.  Tosin Jonita Hirota, NP 08/25/2024, 9:05 AM

## 2025-01-12 ENCOUNTER — Ambulatory Visit (HOSPITAL_COMMUNITY)
